# Patient Record
Sex: Male | Born: 1975 | Race: Black or African American | Hispanic: No | Marital: Single | Smoking: Current some day smoker
Health system: Southern US, Community
[De-identification: ages and names within clinical notes are randomized; demographics above are authoritative.]

## PROBLEM LIST (undated history)

## (undated) DIAGNOSIS — F419 Anxiety disorder, unspecified: Secondary | ICD-10-CM

## (undated) DIAGNOSIS — Z789 Other specified health status: Secondary | ICD-10-CM

## (undated) DIAGNOSIS — F329 Major depressive disorder, single episode, unspecified: Secondary | ICD-10-CM

## (undated) DIAGNOSIS — F32A Depression, unspecified: Secondary | ICD-10-CM

## (undated) HISTORY — DX: Anxiety disorder, unspecified: F41.9

## (undated) HISTORY — DX: Major depressive disorder, single episode, unspecified: F32.9

## (undated) HISTORY — DX: Depression, unspecified: F32.A

---

## 1998-06-16 ENCOUNTER — Emergency Department (HOSPITAL_COMMUNITY): Admission: EM | Admit: 1998-06-16 | Discharge: 1998-06-16 | Payer: Self-pay | Admitting: Emergency Medicine

## 1999-08-04 ENCOUNTER — Encounter: Payer: Self-pay | Admitting: Emergency Medicine

## 1999-08-04 ENCOUNTER — Emergency Department (HOSPITAL_COMMUNITY): Admission: EM | Admit: 1999-08-04 | Discharge: 1999-08-04 | Payer: Self-pay | Admitting: Emergency Medicine

## 2006-07-01 ENCOUNTER — Emergency Department (HOSPITAL_COMMUNITY): Admission: EM | Admit: 2006-07-01 | Discharge: 2006-07-01 | Payer: Self-pay | Admitting: Emergency Medicine

## 2006-07-04 ENCOUNTER — Emergency Department (HOSPITAL_COMMUNITY): Admission: EM | Admit: 2006-07-04 | Discharge: 2006-07-04 | Payer: Self-pay | Admitting: Emergency Medicine

## 2006-11-14 ENCOUNTER — Emergency Department (HOSPITAL_COMMUNITY): Admission: EM | Admit: 2006-11-14 | Discharge: 2006-11-14 | Payer: Self-pay | Admitting: Emergency Medicine

## 2007-04-15 ENCOUNTER — Emergency Department (HOSPITAL_COMMUNITY): Admission: EM | Admit: 2007-04-15 | Discharge: 2007-04-15 | Payer: Self-pay | Admitting: Family Medicine

## 2007-06-29 ENCOUNTER — Emergency Department (HOSPITAL_COMMUNITY): Admission: EM | Admit: 2007-06-29 | Discharge: 2007-06-29 | Payer: Self-pay | Admitting: Family Medicine

## 2008-06-19 ENCOUNTER — Emergency Department (HOSPITAL_COMMUNITY): Admission: EM | Admit: 2008-06-19 | Discharge: 2008-06-19 | Payer: Self-pay | Admitting: Family Medicine

## 2009-05-19 ENCOUNTER — Encounter: Admission: RE | Admit: 2009-05-19 | Discharge: 2009-05-19 | Payer: Self-pay | Admitting: Orthopedic Surgery

## 2009-08-02 ENCOUNTER — Emergency Department (HOSPITAL_COMMUNITY): Admission: EM | Admit: 2009-08-02 | Discharge: 2009-08-02 | Payer: Self-pay | Admitting: Family Medicine

## 2010-09-11 ENCOUNTER — Inpatient Hospital Stay (INDEPENDENT_AMBULATORY_CARE_PROVIDER_SITE_OTHER)
Admission: RE | Admit: 2010-09-11 | Discharge: 2010-09-11 | Disposition: A | Discharge status: Home / Self Care | Payer: Self-pay | Source: Ambulatory Visit | Attending: Family Medicine | Admitting: Family Medicine

## 2010-09-11 DIAGNOSIS — S025XXA Fracture of tooth (traumatic), initial encounter for closed fracture: Secondary | ICD-10-CM

## 2010-09-11 DIAGNOSIS — K047 Periapical abscess without sinus: Secondary | ICD-10-CM

## 2010-09-11 DIAGNOSIS — K029 Dental caries, unspecified: Secondary | ICD-10-CM

## 2010-09-11 DIAGNOSIS — K089 Disorder of teeth and supporting structures, unspecified: Secondary | ICD-10-CM

## 2011-06-28 ENCOUNTER — Emergency Department (INDEPENDENT_AMBULATORY_CARE_PROVIDER_SITE_OTHER)
Admission: EM | Admit: 2011-06-28 | Discharge: 2011-06-28 | Disposition: A | Discharge status: Home / Self Care | Payer: Self-pay | Source: Home / Self Care | Attending: Family Medicine | Admitting: Family Medicine

## 2011-06-28 ENCOUNTER — Emergency Department (INDEPENDENT_AMBULATORY_CARE_PROVIDER_SITE_OTHER): Payer: Self-pay

## 2011-06-28 DIAGNOSIS — S93401A Sprain of unspecified ligament of right ankle, initial encounter: Secondary | ICD-10-CM

## 2011-06-28 DIAGNOSIS — S93409A Sprain of unspecified ligament of unspecified ankle, initial encounter: Secondary | ICD-10-CM

## 2011-06-28 MED ORDER — IBUPROFEN 800 MG PO TABS: 800.0000 mg | ORAL_TABLET | Freq: Three times a day (TID) | ORAL | Status: AC

## 2011-06-28 MED ORDER — HYDROCODONE-ACETAMINOPHEN 5-500 MG PO TABS: 1.0000 | ORAL_TABLET | Freq: Four times a day (QID) | ORAL | Status: AC | PRN
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
## 2011-06-28 NOTE — ED Provider Notes (Signed)
History     CSN: [id number]  Arrival date & time 06/28/11  1558   First MD Initiated Contact with Patient 06/28/11 1740      Chief Complaint  Patient presents with  . Foot Injury    (Consider location/radiation/quality/duration/timing/severity/associated sxs/prior treatment) HPI Comments: 36 y/o male no significant PMH here c/o right ankle swelling and pain since yesterday. Reports he injured his ankle when he slipped going down stairs at home and sprainned his right ankle in varus position. Has been swollen and tender since pain with movement and with putting weight on it. No fractures on X rays.   History reviewed. No pertinent past medical history.  History reviewed. No pertinent past surgical history.  History reviewed. No pertinent family history.  History  Substance Use Topics  . Smoking status: Current Everyday Smoker  . Smokeless tobacco: Not on file  . Alcohol Use: No      Review of Systems  Musculoskeletal:       Right ankle swelling and pain as above  Skin:       No cuts or abrasions   Neurological: Negative for dizziness, seizures, syncope and headaches.  All other systems reviewed and are negative.    Allergies  Review of patient's allergies indicates no known allergies.  Home Medications   Current Outpatient Rx  Name Route Sig Dispense Refill  . HYDROCODONE-ACETAMINOPHEN 5-500 MG PO TABS Oral Take 1-2 tablets by mouth every 6 (six) hours as needed for pain. 15 tablet 0  . IBUPROFEN 800 MG PO TABS Oral Take 1 tablet (800 mg total) by mouth 3 (three) times daily. 21 tablet 0    BP 142/81  Pulse 96  Temp(Src) 98.9 F (37.2 C) (Oral)  Resp 16  SpO2 100%  Physical Exam  Nursing note and vitals reviewed. Constitutional: He is oriented to person, place, and time. He appears well-developed and well-nourished. No distress.  HENT:  Head: Normocephalic and atraumatic.  Eyes: EOM are normal. Pupils are equal, round, and reactive to light.  Neck:  Normal range of motion.  Cardiovascular: Normal heart sounds.   Pulmonary/Chest: Breath sounds normal.  Musculoskeletal:       Right ankle: moderate swelling around lateral malleoli worse than medial malleoli tenderness to palpation below and anterior to lateral malleoli. No bruising or hematoma. Severe pain reported with foot flexion and extension and limited range of motion due to pain. No laxity. Distal tibial bone with no focal tenderness swelling or hematoma. Normal dorsomedial and tibial posterior pulses, no distal cyanosis. Sensation intact.  Neurological: He is alert and oriented to person, place, and time.    ED Course  Procedures (including critical care time)  Labs Reviewed - No data to display Dg Ankle Complete Right  06/28/2011  *RADIOLOGY REPORT*  Clinical Data: Fall, ankle swelling  RIGHT ANKLE - COMPLETE 3+ VIEW  Comparison: None.  Findings: Ankle mortise intact.  Talar dome is normal.  No malleolar fracture.  Calcaneus is normal.  IMPRESSION: No evidence of ankle fracture.  Original Report Authenticated By: Genevive Bi, M.D.     1. Right ankle sprain       MDM  Continue ice and elevation, ASO brace, crutches, NSAIDs rehab exercises hand out and follow up with sports medicine as needed.        Sharin Grave, MD 06/29/11 1013

## 2011-08-03 ENCOUNTER — Emergency Department (HOSPITAL_COMMUNITY)
Admission: EM | Admit: 2011-08-03 | Discharge: 2011-08-03 | Disposition: A | Discharge status: Home / Self Care | Payer: No Typology Code available for payment source | Attending: Emergency Medicine | Admitting: Emergency Medicine

## 2011-08-03 ENCOUNTER — Encounter (HOSPITAL_COMMUNITY): Payer: Self-pay

## 2011-08-03 DIAGNOSIS — S39012A Strain of muscle, fascia and tendon of lower back, initial encounter: Secondary | ICD-10-CM

## 2011-08-03 DIAGNOSIS — S339XXA Sprain of unspecified parts of lumbar spine and pelvis, initial encounter: Secondary | ICD-10-CM | POA: Insufficient documentation

## 2011-08-03 DIAGNOSIS — Y92410 Unspecified street and highway as the place of occurrence of the external cause: Secondary | ICD-10-CM | POA: Insufficient documentation

## 2011-08-03 MED ORDER — HYDROCODONE-ACETAMINOPHEN 5-325 MG PO TABS
1.0000 | ORAL_TABLET | Freq: Once | ORAL | Status: AC
  Administered 2011-08-03: 1 via ORAL
  Filled 2011-08-03: qty 1

## 2011-08-03 MED ORDER — IBUPROFEN 800 MG PO TABS: 800.0000 mg | ORAL_TABLET | Freq: Three times a day (TID) | ORAL | Status: AC | PRN

## 2011-08-03 MED ORDER — HYDROCODONE-ACETAMINOPHEN 5-325 MG PO TABS: 2.0000 | ORAL_TABLET | ORAL | Status: AC | PRN

## 2011-08-03 MED ORDER — IBUPROFEN 800 MG PO TABS
800.0000 mg | ORAL_TABLET | Freq: Once | ORAL | Status: AC
  Administered 2011-08-03: 800 mg via ORAL
  Filled 2011-08-03: qty 1

## 2011-08-03 MED ORDER — DIAZEPAM 5 MG PO TABS
5.0000 mg | ORAL_TABLET | Freq: Once | ORAL | Status: AC
  Administered 2011-08-03: 5 mg via ORAL
  Filled 2011-08-03: qty 1

## 2011-08-03 MED ORDER — DIAZEPAM 5 MG PO TABS: 5.0000 mg | ORAL_TABLET | Freq: Three times a day (TID) | ORAL | Status: AC | PRN
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
## 2011-08-04 NOTE — ED Provider Notes (Signed)
Medical screening examination/treatment/procedure(s) were performed by non-physician practitioner and as supervising physician I was immediately available for consultation/collaboration.  Graysin Luczynski, MD 08/04/11 0237 

## 2011-08-14 ENCOUNTER — Encounter (HOSPITAL_COMMUNITY): Payer: Self-pay | Admitting: *Deleted

## 2011-08-14 ENCOUNTER — Emergency Department (HOSPITAL_COMMUNITY)
Admission: EM | Admit: 2011-08-14 | Discharge: 2011-08-14 | Disposition: A | Discharge status: Home Health | Payer: No Typology Code available for payment source | Attending: Emergency Medicine | Admitting: Emergency Medicine

## 2011-08-14 DIAGNOSIS — M545 Low back pain, unspecified: Secondary | ICD-10-CM | POA: Insufficient documentation

## 2011-08-14 DIAGNOSIS — M5380 Other specified dorsopathies, site unspecified: Secondary | ICD-10-CM | POA: Insufficient documentation

## 2011-08-14 DIAGNOSIS — F17200 Nicotine dependence, unspecified, uncomplicated: Secondary | ICD-10-CM | POA: Insufficient documentation

## 2011-08-14 DIAGNOSIS — M79609 Pain in unspecified limb: Secondary | ICD-10-CM | POA: Insufficient documentation

## 2011-08-14 DIAGNOSIS — M62830 Muscle spasm of back: Secondary | ICD-10-CM

## 2011-08-14 DIAGNOSIS — M549 Dorsalgia, unspecified: Secondary | ICD-10-CM

## 2011-08-14 MED ORDER — CYCLOBENZAPRINE HCL 10 MG PO TABS: 10.0000 mg | ORAL_TABLET | Freq: Three times a day (TID) | ORAL | Status: AC | PRN

## 2011-08-14 MED ORDER — DIAZEPAM 5 MG PO TABS: 5.0000 mg | ORAL_TABLET | Freq: Two times a day (BID) | ORAL | Status: AC

## 2011-08-14 NOTE — ED Provider Notes (Signed)
Medical screening examination/treatment/procedure(s) were performed by non-physician practitioner and as supervising physician I was immediately available for consultation/collaboration.  Demetris Meinhardt K Linker, MD 08/14/11 0945 

## 2011-08-14 NOTE — Discharge Instructions (Signed)
Back Exercises Back exercises help treat and prevent back injuries. The goal of back exercises is to increase the strength of your abdominal and back muscles and the flexibility of your back. These exercises should be started when you no longer have back pain. Back exercises include:  Pelvic Tilt. Lie on your back with your knees bent. Tilt your pelvis until the lower part of your back is against the floor. Hold this position 5 to 10 sec and repeat 5 to 10 times.   Knee to Chest. Pull first 1 knee up against your chest and hold for 20 to 30 seconds, repeat this with the other knee, and then both knees. This may be done with the other leg straight or bent, whichever feels better.   Sit-Ups or Curl-Ups. Bend your knees 90 degrees. Start with tilting your pelvis, and do a partial, slow sit-up, lifting your trunk only 30 to 45 degrees off the floor. Take at least 2 to 3 seconds for each sit-up. Do not do sit-ups with your knees out straight. If partial sit-ups are difficult, simply do the above but with only tightening your abdominal muscles and holding it as directed.   Hip-Lift. Lie on your back with your knees flexed 90 degrees. Push down with your feet and shoulders as you raise your hips a couple inches off the floor; hold for 10 seconds, repeat 5 to 10 times.   Back arches. Lie on your stomach, propping yourself up on bent elbows. Slowly press on your hands, causing an arch in your low back. Repeat 3 to 5 times. Any initial stiffness and discomfort should lessen with repetition over time.   Shoulder-Lifts. Lie face down with arms beside your body. Keep hips and torso pressed to floor as you slowly lift your head and shoulders off the floor.  Do not overdo your exercises, especially in the beginning. Exercises may cause you some mild back discomfort which lasts for a few minutes; however, if the pain is more severe, or lasts for more than 15 minutes, do not continue exercises until you see your  caregiver. Improvement with exercise therapy for back problems is slow.  See your caregivers for assistance with developing a proper back exercise program. Document Released: 07/15/2004 Document Revised: 02/03/2011 Document Reviewed: 06/07/2005 [facility] Patient Information 2012 [place], [place].  Lumbosacral Strain Lumbosacral strain is one of the most common causes of back pain. There are many causes of back pain. Most are not serious conditions. CAUSES  Your backbone (spinal column) is made up of 24 main vertebral bodies, the sacrum, and the coccyx. These are held together by muscles and tough, fibrous tissue (ligaments). Nerve roots pass through the openings between the vertebrae. A sudden move or injury to the back may cause injury to, or pressure on, these nerves. This may result in localized back pain or pain movement (radiation) into the buttocks, down the leg, and into the foot. Sharp, shooting pain from the buttock down the back of the leg (sciatica) is frequently associated with a ruptured (herniated) disk. Pain may be caused by muscle spasm alone. Your caregiver can often find the cause of your pain by the details of your symptoms and an exam. In some cases, you may need tests (such as X-rays). Your caregiver will work with you to decide if any tests are needed based on your specific exam. HOME CARE INSTRUCTIONS   Avoid an underactive lifestyle. Active exercise, as directed by your caregiver, is your greatest weapon against back pain.  Avoid hard physical activities (tennis, racquetball, waterskiing) if you are not in proper physical condition for it. This may aggravate or create problems.   If you have a back problem, avoid sports requiring sudden body movements. Swimming and walking are generally safer activities.   Maintain good posture.   Avoid becoming overweight (obese).   Use bed rest for only the most extreme, sudden (acute) episode. Your caregiver will help you determine how  much bed rest is necessary.   For acute conditions, you may put ice on the injured area.   Put ice in a plastic bag.   Place a towel between your skin and the bag.   Leave the ice on for 15 to 20 minutes at a time, every 2 hours, or as needed.   After you are improved and more active, it may help to apply heat for 30 minutes before activities.  See your caregiver if you are having pain that lasts longer than expected. Your caregiver can advise appropriate exercises or therapy if needed. With conditioning, most back problems can be avoided. SEEK IMMEDIATE MEDICAL CARE IF:   You have numbness, tingling, weakness, or problems with the use of your arms or legs.   You experience severe back pain not relieved with medicines.   There is a change in bowel or bladder control.   You have increasing pain in any area of the body, including your belly (abdomen).   You notice shortness of breath, dizziness, or feel faint.   You feel sick to your stomach (nauseous), are throwing up (vomiting), or become sweaty.   You notice discoloration of your toes or legs, or your feet get very cold.   Your back pain is getting worse.   You have a fever.  MAKE SURE YOU:   Understand these instructions.   Will watch your condition.   Will get help right away if you are not doing well or get worse.  Document Released: 03/17/2005 Document Revised: 02/17/2011 Document Reviewed: 09/06/2008 [facility] Patient Information 2012 [place], [place].
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
## 2011-10-28 ENCOUNTER — Encounter (HOSPITAL_COMMUNITY): Payer: Self-pay | Admitting: Emergency Medicine

## 2011-10-28 ENCOUNTER — Emergency Department (INDEPENDENT_AMBULATORY_CARE_PROVIDER_SITE_OTHER)
Admission: EM | Admit: 2011-10-28 | Discharge: 2011-10-28 | Disposition: A | Discharge status: Home / Self Care | Payer: Self-pay | Source: Home / Self Care | Attending: Family Medicine | Admitting: Family Medicine

## 2011-10-28 DIAGNOSIS — M549 Dorsalgia, unspecified: Secondary | ICD-10-CM

## 2011-10-28 MED ORDER — KETOROLAC TROMETHAMINE 60 MG/2ML IM SOLN
INTRAMUSCULAR | Status: AC
  Filled 2011-10-28: qty 2

## 2011-10-28 MED ORDER — ONDANSETRON HCL 4 MG/2ML IJ SOLN
4.0000 mg | Freq: Once | INTRAMUSCULAR | Status: AC
  Administered 2011-10-28: 4 mg via INTRAMUSCULAR

## 2011-10-28 MED ORDER — HYDROMORPHONE HCL PF 1 MG/ML IJ SOLN
INTRAMUSCULAR | Status: AC
  Filled 2011-10-28: qty 1

## 2011-10-28 MED ORDER — ONDANSETRON HCL 4 MG/2ML IJ SOLN
INTRAMUSCULAR | Status: AC
  Filled 2011-10-28: qty 2

## 2011-10-28 MED ORDER — OXYCODONE-ACETAMINOPHEN 5-325 MG PO TABS: 2.0000 | ORAL_TABLET | ORAL | Status: AC | PRN

## 2011-10-28 MED ORDER — KETOROLAC TROMETHAMINE 60 MG/2ML IM SOLN
60.0000 mg | Freq: Once | INTRAMUSCULAR | Status: AC
  Administered 2011-10-28: 60 mg via INTRAMUSCULAR

## 2011-10-28 MED ORDER — HYDROMORPHONE HCL PF 1 MG/ML IJ SOLN
INTRAMUSCULAR | Status: AC
  Filled 2011-10-28: qty 2

## 2011-10-28 MED ORDER — DICLOFENAC SODIUM 75 MG PO TBEC: 75.0000 mg | DELAYED_RELEASE_TABLET | Freq: Two times a day (BID) | ORAL | Status: DC

## 2011-10-28 MED ORDER — PREDNISONE 10 MG PO TABS: ORAL_TABLET | ORAL | Status: DC

## 2011-10-28 MED ORDER — HYDROMORPHONE HCL PF 1 MG/ML IJ SOLN
2.0000 mg | Freq: Once | INTRAMUSCULAR | Status: AC
  Administered 2011-10-28: 2 mg via INTRAMUSCULAR

## 2011-10-28 NOTE — Discharge Instructions (Signed)
Back Pain, Adult Low back pain is very common. About 1 in 5 people have back pain.The cause of low back pain is rarely dangerous. The pain often gets better over time.About half of people with a sudden onset of back pain feel better in just 2 weeks. About 8 in 10 people feel better by 6 weeks.  CAUSES Some common causes of back pain include:  Strain of the muscles or ligaments supporting the spine.   Wear and tear (degeneration) of the spinal discs.   Arthritis.   Direct injury to the back.  DIAGNOSIS Most of the time, the direct cause of low back pain is not known.However, back pain can be treated effectively even when the exact cause of the pain is unknown.Answering your caregiver's questions about your overall health and symptoms is one of the most accurate ways to make sure the cause of your pain is not dangerous. If your caregiver needs more information, he or she may order lab work or imaging tests (X-rays or MRIs).However, even if imaging tests show changes in your back, this usually does not require surgery. HOME CARE INSTRUCTIONS For many people, back pain returns.Since low back pain is rarely dangerous, it is often a condition that people can learn to manageon their own.   Remain active. It is stressful on the back to sit or stand in one place. Do not sit, drive, or stand in one place for more than 30 minutes at a time. Take short walks on level surfaces as soon as pain allows.Try to increase the length of time you walk each day.   Do not stay in bed.Resting more than 1 or 2 days can delay your recovery.   Do not avoid exercise or work.Your body is made to move.It is not dangerous to be active, even though your back may hurt.Your back will likely heal faster if you return to being active before your pain is gone.   Pay attention to your body when you bend and lift. Many people have less discomfortwhen lifting if they bend their knees, keep the load close to their  bodies,and avoid twisting. Often, the most comfortable positions are those that put less stress on your recovering back.   Find a comfortable position to sleep. Use a firm mattress and lie on your side with your knees slightly bent. If you lie on your back, put a pillow under your knees.   Only take over-the-counter or prescription medicines as directed by your caregiver. Over-the-counter medicines to reduce pain and inflammation are often the most helpful.Your caregiver may prescribe muscle relaxant drugs.These medicines help dull your pain so you can more quickly return to your normal activities and healthy exercise.   Put ice on the injured area.   Put ice in a plastic bag.   Place a towel between your skin and the bag.   Leave the ice on for 15 to 20 minutes, 3 to 4 times a day for the first 2 to 3 days. After that, ice and heat may be alternated to reduce pain and spasms.   Ask your caregiver about trying back exercises and gentle massage. This may be of some benefit.   Avoid feeling anxious or stressed.Stress increases muscle tension and can worsen back pain.It is important to recognize when you are anxious or stressed and learn ways to manage it.Exercise is a great option.  SEEK MEDICAL CARE IF:  You have pain that is not relieved with rest or medicine.   You have   pain that does not improve in 1 week.   You have new symptoms.   You are generally not feeling well.  SEEK IMMEDIATE MEDICAL CARE IF:   You have pain that radiates from your back into your legs.   You develop new bowel or bladder control problems.   You have unusual weakness or numbness in your arms or legs.   You develop nausea or vomiting.   You develop abdominal pain.   You feel faint.  Document Released: 06/07/2005 Document Revised: 05/27/2011 Document Reviewed: 10/26/2010 ExitCare Patient Information 2012 [employer]. 
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
## 2011-10-29 NOTE — ED Provider Notes (Signed)
Medical screening examination/treatment/procedure(s) were performed by resident physician or non-physician practitioner and as supervising physician I was immediately available for consultation/collaboration.   Alanee Ting DOUGLAS MD.    Makenli Derstine D Almus Woodham, MD 10/29/11 1220 

## 2011-12-18 ENCOUNTER — Ambulatory Visit: Payer: Self-pay | Admitting: Internal Medicine

## 2011-12-18 VITALS — BP 136/92 | HR 80 | Temp 98.4°F | Resp 16 | Ht 69.5 in | Wt 211.8 lb

## 2011-12-18 DIAGNOSIS — F32A Depression, unspecified: Secondary | ICD-10-CM | POA: Insufficient documentation

## 2011-12-18 DIAGNOSIS — F329 Major depressive disorder, single episode, unspecified: Secondary | ICD-10-CM

## 2011-12-18 DIAGNOSIS — F17200 Nicotine dependence, unspecified, uncomplicated: Secondary | ICD-10-CM | POA: Insufficient documentation

## 2011-12-18 MED ORDER — CITALOPRAM HYDROBROMIDE 20 MG PO TABS: 20.0000 mg | ORAL_TABLET | Freq: Every day | ORAL | Status: DC
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
## 2012-03-09 ENCOUNTER — Telehealth: Payer: Self-pay

## 2012-03-09 MED ORDER — CITALOPRAM HYDROBROMIDE 20 MG PO TABS: 20.0000 mg | ORAL_TABLET | Freq: Every day | ORAL | Status: DC

## 2012-03-09 NOTE — Telephone Encounter (Signed)
Refill sent in. Will need office visit next time.

## 2012-03-09 NOTE — Telephone Encounter (Signed)
Pt would like to see if he can get a rx refill on his depression meds and he is not sure if he can or not. [phone].

## 2012-03-10 NOTE — Telephone Encounter (Signed)
I advised patient he needs to be seen for additional refills/ before these run out.

## 2012-03-10 NOTE — Telephone Encounter (Signed)
No anw/VM not set up on cell # left by pt. H# is not valid. Unable to LM

## 2012-06-06 DIAGNOSIS — E119 Type 2 diabetes mellitus without complications: Secondary | ICD-10-CM | POA: Insufficient documentation

## 2013-03-08 ENCOUNTER — Ambulatory Visit: Payer: Self-pay | Admitting: Physician Assistant

## 2013-03-08 VITALS — BP 120/92 | HR 89 | Temp 98.9°F | Resp 18 | Ht 69.5 in | Wt 221.0 lb

## 2013-03-08 DIAGNOSIS — F418 Other specified anxiety disorders: Secondary | ICD-10-CM

## 2013-03-08 DIAGNOSIS — F341 Dysthymic disorder: Secondary | ICD-10-CM

## 2013-03-08 MED ORDER — CITALOPRAM HYDROBROMIDE 40 MG PO TABS: 40.0000 mg | ORAL_TABLET | Freq: Every day | ORAL | Status: DC

## 2013-03-08 NOTE — Patient Instructions (Addendum)
For the 1st 4 pills take a half Call me in a 1 month about result and for a refill.

## 2013-03-08 NOTE — Progress Notes (Signed)
   [address] [place] [address]   Phone [phone]  Subjective:    Patient ID: Lance Cline, male    DOB: [birth year], 37 y.o.   MRN: [id number]  HPI Pt presents to clinic for another Rx of Celexa.  He took it about 5 months and felt like it helped but he did not feel like his depression was completely under control.  He has been off these medications for months and feels like he needs to be back on the medications.  His sleep is slightly disrupted but he would like to try and get his depression and anxiety controlled 1st.  He is concerned about multiple office visits due to no insurance.  He would like to quit smoking.  Review of Systems  Psychiatric/Behavioral: Positive for sleep disturbance and dysphoric mood. Negative for suicidal ideas. The patient is nervous/anxious.       Objective:   Physical Exam  Vitals reviewed. Constitutional: He is oriented to person, place, and time. He appears well-developed and well-nourished.  HENT:  Head: Normocephalic and atraumatic.  Right Ear: External ear normal.  Left Ear: External ear normal.  Pulmonary/Chest: Effort normal.  Neurological: He is alert and oriented to person, place, and time.  Psychiatric: He has a normal mood and affect. His behavior is normal. Judgment and thought content normal.       Assessment & Plan:  Depression with anxiety - Plan: citalopram (CELEXA) 40 MG tablet  Plan to restart Celexa - will start at Celexa 20mg  and increase to 40mg  within a week.  He will monitor his results and call me in a month with his progress and if he is happy with the results we will do a 90d supply.  If still having trouble with sleep will add Trazodone.    Benny Lennert PA-C 03/08/2013 4:54 PM

## 2013-04-01 ENCOUNTER — Telehealth: Payer: Self-pay

## 2013-04-01 NOTE — Telephone Encounter (Signed)
PATIENT STATES HE HAS BEEN TRYING TO SPEAK WITH SARAH WEBER. HE IS CALLING REGARDING THE MEDICATION SHE PUT HIM ON. HE WOULD NOT GIVE ANY OTHER ADDITIONAL INFORMATION. BEST PHONE [phone] (CELL)  MBC

## 2013-04-04 NOTE — Telephone Encounter (Signed)
Spoke to patient, he states he was given meds for depression, they are working fine, he does not feel as depressed. He states he is having problems with getting/ maintaining his erections. He indicates he does not want to change the medication because it helps him. He wants to know if he can get Cialis

## 2013-04-08 ENCOUNTER — Other Ambulatory Visit: Payer: Self-pay | Admitting: Physician Assistant

## 2013-04-08 ENCOUNTER — Telehealth: Payer: Self-pay

## 2013-04-08 NOTE — Telephone Encounter (Signed)
Patient calling to get a refill on some medication for his depression. citalopram (CELEXA) 40 MG tablet. He says that he was supposed to let Huey Romans know so she can authorize the refill. I told him I will put in the message for him not a problem. I also told him to still contact the pharmacy to send [place] an electronic request. Just in case. Please advise. He is also waiting on a call from someone clinical about his male issues, he would not give more info. Please see previous message in Epic. thank you!  Pharmacy: [id number] - [place], [place] - [address]  [phone]

## 2013-04-09 ENCOUNTER — Telehealth: Payer: Self-pay | Admitting: Radiology

## 2013-04-09 MED ORDER — TADALAFIL 10 MG PO TABS: 10.0000 mg | ORAL_TABLET | Freq: Every day | ORAL | Status: DC | PRN

## 2013-04-09 NOTE — Telephone Encounter (Signed)
I have a couple of thoughts - 1- If the med is causing the SE I think that changing the meds would be the best - I would switch to Lexapro but it might be more expensive and then using Cialis rarely.  So I am willing to write this medication and I will send it to the pharmacy for the patient.  If this has happened when he is off the med in the past he might think about getting a testosterone level drawn to make sure that is not it.

## 2013-04-09 NOTE — Telephone Encounter (Signed)
Patient notified and voiced understanding. He is not wanting to change medication. He states medication is working great and does not want to change.

## 2013-04-09 NOTE — Telephone Encounter (Signed)
Patient asked for celexa to be called in to [facility] on [address] (originally sent to [facility] which is now closed) patient going out of town in morning so requested to pick up medication tonight.  Called [facility] to [facility] pharmacy for patient

## 2013-04-09 NOTE — Telephone Encounter (Signed)
I called pt back to advise him that I had RFd his citalopram earlier today. 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 Pt reports he has used Cialis in the past and it has been effective for him and he had no SEs from it. Sarah, please have someone call pt back if possible after you address.

## 2013-04-09 NOTE — Telephone Encounter (Signed)
Pt is calling to see about his medication refill he states he has been calling for a week and hasnt heard anything. Call back number  [phone]

## 2013-04-09 NOTE — Telephone Encounter (Signed)
Pt has called back again and would like if Benny Lennert can call him back asap Call back number is [phone]

## 2013-04-09 NOTE — Telephone Encounter (Signed)
Correction: cialis 10mg  needed to be sent to [facility] on [address] (not celexa as stated in previous telephone message) spoke with pharmacist and corrected this.  Pharmacist canceled celexa.

## 2013-04-09 NOTE — Telephone Encounter (Signed)
Took care of in another message.

## 2014-01-04 ENCOUNTER — Other Ambulatory Visit: Payer: Self-pay | Admitting: Physician Assistant

## 2014-01-05 ENCOUNTER — Telehealth: Payer: Self-pay

## 2014-01-05 NOTE — Telephone Encounter (Signed)
PATIETNT IS CALLING BECAUSE [facility] AND THE PHARMACY HASN'T RECEIVED ANYTHING. HE WOULD LIKE HIS MEDICATION FOR CITALOPRAM TODAY IF POSSIBLE. 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 PLEASE CALL PATIENT! [phone]

## 2014-01-05 NOTE — Telephone Encounter (Signed)
Pt call  and spoke with kaya, told her he had not heard anything, kaya began asking questions and he became verbally aggressive and she terminated the call Pt called back, I received the call patient frustrated because we have not responded to a pharmacy request (pharmacy in [place]) in trying to sort what was going on for him, I explained I would take this information and forward his concerns regarding refill request to clinical TL and he wanted me to guarantee we would call in rx today, I explained I cant make any guarantees, pt states "i'll get a new doctor" and he hung up on me

## 2014-01-07 MED ORDER — CITALOPRAM HYDROBROMIDE 40 MG PO TABS: ORAL_TABLET | ORAL | Status: DC

## 2014-01-07 NOTE — Telephone Encounter (Signed)
Spoke w/pt and advised that he is overdue for f/up, but that I can send in a 30 day supply to hold him until he returns to town (he is a Naval architecttruck driver). Pt agreed and gave me new pharm in [place] to send it. Done.

## 2014-03-01 ENCOUNTER — Ambulatory Visit (INDEPENDENT_AMBULATORY_CARE_PROVIDER_SITE_OTHER): Payer: Self-pay | Admitting: Physician Assistant

## 2014-03-01 VITALS — BP 122/78 | HR 95 | Temp 98.0°F | Resp 17 | Ht 70.0 in | Wt 224.0 lb

## 2014-03-01 DIAGNOSIS — Z202 Contact with and (suspected) exposure to infections with a predominantly sexual mode of transmission: Secondary | ICD-10-CM

## 2014-03-01 DIAGNOSIS — F3289 Other specified depressive episodes: Secondary | ICD-10-CM

## 2014-03-01 DIAGNOSIS — N529 Male erectile dysfunction, unspecified: Secondary | ICD-10-CM

## 2014-03-01 DIAGNOSIS — F32A Depression, unspecified: Secondary | ICD-10-CM

## 2014-03-01 DIAGNOSIS — F329 Major depressive disorder, single episode, unspecified: Secondary | ICD-10-CM

## 2014-03-01 MED ORDER — CITALOPRAM HYDROBROMIDE 40 MG PO TABS: 40.0000 mg | ORAL_TABLET | Freq: Every day | ORAL | Status: DC

## 2014-03-01 MED ORDER — METRONIDAZOLE 500 MG PO TABS: 2000.0000 mg | ORAL_TABLET | Freq: Once | ORAL | Status: AC

## 2014-03-01 MED ORDER — TADALAFIL 10 MG PO TABS: 10.0000 mg | ORAL_TABLET | Freq: Every day | ORAL | Status: DC | PRN

## 2014-03-01 MED ORDER — TADALAFIL 20 MG PO TABS: 20.0000 mg | ORAL_TABLET | ORAL | Status: DC | PRN

## 2014-03-01 NOTE — Progress Notes (Signed)
   Subjective:    Patient ID: Lance Cline, male    DOB: January 15, 1976, 38 y.o.   MRN: [id number]  HPI Pt presents to clinic for several concerns. 1- needs a refill of his Celexa.  He ran out a few days ago and knows he feels better while on the medication.  Over the last few days he has had some withdraw symptoms.  When off it he is irritable and has depressed mood. 2- would like a refill on his Cialis - he does not think that  always is enough and sometimes has to take 2 pills to maintain an erection.  He does not have trouble all the time but it works when he does. 3- he is in a stable monogamous relationship currently but his previous partner recently told him that she had been diagnosed with trich and he should be treated.  He does not have any dysuria, penile discharge.    Review of Systems  Genitourinary: Negative for discharge and penile pain.  Psychiatric/Behavioral: Positive for dysphoric mood. Negative for sleep disturbance. The patient is nervous/anxious.        Objective:   Physical Exam  Constitutional: He is oriented to person, place, and time. He appears well-developed and well-nourished.  HENT:  Head: Normocephalic and atraumatic.  Right Ear: External ear normal.  Left Ear: External ear normal.  Eyes: Conjunctivae are normal.  Cardiovascular: Normal rate, regular rhythm and normal heart sounds.   No murmur heard. Pulmonary/Chest: Effort normal and breath sounds normal. He has no wheezes.  Neurological: He is alert and oriented to person, place, and time.  Skin: Skin is warm and dry.  Psychiatric: He has a normal mood and affect. His behavior is normal. Judgment and thought content normal.       Assessment & Plan:  Exposure to trichomonas - We will treat for trich due to cost of test.  Did recommend him getting checked for gonorrhea and chlamydia but he states that his current partner has insurance and he will get her to be tested and then if she is positive for  anything he will get tested.  They are not currently using barrier protection.  Plan: metroNIDAZOLE (FLAGYL) 500 MG tablet  Depression - Continue medication. Plan: citalopram (CELEXA) 40 MG tablet  Impotence - Continue medication as needed.  Suggested that pt get his testosterone checked at some point.  He states that he plans to have a CPE at some point with me after he saves some money.  Plan: tadalafil (CIALIS) 20 MG tablet, DISCONTINUED: tadalafil (CIALIS) 10 MG tablet  Benny Lennert PA-C  [facility] 03/01/2014 12:30 PM

## 2014-05-24 ENCOUNTER — Telehealth: Payer: Self-pay

## 2014-05-24 MED ORDER — METRONIDAZOLE 500 MG PO TABS: ORAL_TABLET | ORAL | Status: DC

## 2014-05-24 NOTE — Telephone Encounter (Signed)
Rx re-sent. Notified pt.

## 2014-05-24 NOTE — Telephone Encounter (Signed)
Per Lance LennertSarah Cline he can have a refill

## 2014-05-24 NOTE — Telephone Encounter (Addendum)
Patient says he lost rx for metronidazole and needs another

## 2014-06-11 ENCOUNTER — Telehealth: Payer: Self-pay

## 2014-06-11 DIAGNOSIS — N529 Male erectile dysfunction, unspecified: Secondary | ICD-10-CM

## 2014-06-11 NOTE — Telephone Encounter (Signed)
Patient requesting refill on Cialis.

## 2014-06-13 MED ORDER — TADALAFIL 20 MG PO TABS: 20.0000 mg | ORAL_TABLET | ORAL | Status: DC | PRN

## 2014-08-27 ENCOUNTER — Telehealth: Payer: Self-pay

## 2014-08-27 ENCOUNTER — Other Ambulatory Visit: Payer: Self-pay | Admitting: Physician Assistant

## 2014-08-27 NOTE — Telephone Encounter (Signed)
Done

## 2014-08-27 NOTE — Telephone Encounter (Signed)
Spoke with pt, advised Rx was sent. Pt understood.

## 2014-08-27 NOTE — Telephone Encounter (Signed)
Patient would like a refill of Cialis.  He leaves for work tomorrow morning, so if possible, he would like [facility] [address]  CB#: [phone]

## 2014-10-26 ENCOUNTER — Inpatient Hospital Stay (HOSPITAL_COMMUNITY): Payer: Self-pay

## 2014-10-26 ENCOUNTER — Emergency Department (HOSPITAL_COMMUNITY): Payer: MEDICAID

## 2014-10-26 ENCOUNTER — Encounter (HOSPITAL_COMMUNITY): Admission: EM | Disposition: A | Discharge status: Rehabilitation Facility | Payer: Self-pay | Source: Home / Self Care

## 2014-10-26 ENCOUNTER — Encounter (HOSPITAL_COMMUNITY): Payer: Self-pay | Admitting: *Deleted

## 2014-10-26 ENCOUNTER — Emergency Department (HOSPITAL_COMMUNITY): Payer: Self-pay

## 2014-10-26 ENCOUNTER — Inpatient Hospital Stay (HOSPITAL_COMMUNITY): Payer: Self-pay | Admitting: Anesthesiology

## 2014-10-26 ENCOUNTER — Inpatient Hospital Stay (HOSPITAL_COMMUNITY)
Admission: EM | Admit: 2014-10-26 | Discharge: 2014-11-20 | DRG: 956 | Disposition: A | Discharge status: Rehabilitation Facility | Payer: Self-pay | Attending: Surgery | Admitting: Surgery

## 2014-10-26 ENCOUNTER — Inpatient Hospital Stay (HOSPITAL_COMMUNITY): Payer: MEDICAID | Admitting: Anesthesiology

## 2014-10-26 ENCOUNTER — Inpatient Hospital Stay (HOSPITAL_COMMUNITY): Payer: MEDICAID

## 2014-10-26 DIAGNOSIS — Z09 Encounter for follow-up examination after completed treatment for conditions other than malignant neoplasm: Secondary | ICD-10-CM

## 2014-10-26 DIAGNOSIS — E46 Unspecified protein-calorie malnutrition: Secondary | ICD-10-CM | POA: Diagnosis not present

## 2014-10-26 DIAGNOSIS — S32810A Multiple fractures of pelvis with stable disruption of pelvic ring, initial encounter for closed fracture: Secondary | ICD-10-CM | POA: Diagnosis present

## 2014-10-26 DIAGNOSIS — F17210 Nicotine dependence, cigarettes, uncomplicated: Secondary | ICD-10-CM | POA: Diagnosis present

## 2014-10-26 DIAGNOSIS — T1490XA Injury, unspecified, initial encounter: Secondary | ICD-10-CM

## 2014-10-26 DIAGNOSIS — T148XXA Other injury of unspecified body region, initial encounter: Secondary | ICD-10-CM

## 2014-10-26 DIAGNOSIS — K5900 Constipation, unspecified: Secondary | ICD-10-CM | POA: Diagnosis not present

## 2014-10-26 DIAGNOSIS — Y906 Blood alcohol level of 120-199 mg/100 ml: Secondary | ICD-10-CM | POA: Diagnosis present

## 2014-10-26 DIAGNOSIS — S7292XA Unspecified fracture of left femur, initial encounter for closed fracture: Secondary | ICD-10-CM

## 2014-10-26 DIAGNOSIS — S3282XA Multiple fractures of pelvis without disruption of pelvic ring, initial encounter for closed fracture: Secondary | ICD-10-CM | POA: Diagnosis present

## 2014-10-26 DIAGNOSIS — Z6831 Body mass index (BMI) 31.0-31.9, adult: Secondary | ICD-10-CM

## 2014-10-26 DIAGNOSIS — L299 Pruritus, unspecified: Secondary | ICD-10-CM | POA: Diagnosis not present

## 2014-10-26 DIAGNOSIS — S82402A Unspecified fracture of shaft of left fibula, initial encounter for closed fracture: Secondary | ICD-10-CM | POA: Diagnosis present

## 2014-10-26 DIAGNOSIS — S299XXA Unspecified injury of thorax, initial encounter: Secondary | ICD-10-CM

## 2014-10-26 DIAGNOSIS — I959 Hypotension, unspecified: Secondary | ICD-10-CM | POA: Diagnosis present

## 2014-10-26 DIAGNOSIS — S32402A Unspecified fracture of left acetabulum, initial encounter for closed fracture: Secondary | ICD-10-CM | POA: Diagnosis present

## 2014-10-26 DIAGNOSIS — E440 Moderate protein-calorie malnutrition: Secondary | ICD-10-CM | POA: Diagnosis present

## 2014-10-26 DIAGNOSIS — S32119A Unspecified Zone I fracture of sacrum, initial encounter for closed fracture: Secondary | ICD-10-CM | POA: Diagnosis present

## 2014-10-26 DIAGNOSIS — S7291XA Unspecified fracture of right femur, initial encounter for closed fracture: Secondary | ICD-10-CM | POA: Diagnosis present

## 2014-10-26 DIAGNOSIS — S81802A Unspecified open wound, left lower leg, initial encounter: Secondary | ICD-10-CM | POA: Diagnosis present

## 2014-10-26 DIAGNOSIS — IMO0002 Reserved for concepts with insufficient information to code with codable children: Secondary | ICD-10-CM

## 2014-10-26 DIAGNOSIS — Z419 Encounter for procedure for purposes other than remedying health state, unspecified: Secondary | ICD-10-CM

## 2014-10-26 DIAGNOSIS — S72351A Displaced comminuted fracture of shaft of right femur, initial encounter for closed fracture: Secondary | ICD-10-CM | POA: Diagnosis present

## 2014-10-26 DIAGNOSIS — S72352C Displaced comminuted fracture of shaft of left femur, initial encounter for open fracture type IIIA, IIIB, or IIIC: Principal | ICD-10-CM | POA: Diagnosis present

## 2014-10-26 DIAGNOSIS — R52 Pain, unspecified: Secondary | ICD-10-CM

## 2014-10-26 DIAGNOSIS — J9811 Atelectasis: Secondary | ICD-10-CM | POA: Diagnosis not present

## 2014-10-26 DIAGNOSIS — F10129 Alcohol abuse with intoxication, unspecified: Secondary | ICD-10-CM | POA: Diagnosis present

## 2014-10-26 DIAGNOSIS — S32409A Unspecified fracture of unspecified acetabulum, initial encounter for closed fracture: Secondary | ICD-10-CM

## 2014-10-26 DIAGNOSIS — J029 Acute pharyngitis, unspecified: Secondary | ICD-10-CM | POA: Diagnosis not present

## 2014-10-26 DIAGNOSIS — J9600 Acute respiratory failure, unspecified whether with hypoxia or hypercapnia: Secondary | ICD-10-CM | POA: Diagnosis not present

## 2014-10-26 DIAGNOSIS — D62 Acute posthemorrhagic anemia: Secondary | ICD-10-CM | POA: Diagnosis not present

## 2014-10-26 DIAGNOSIS — R000 Tachycardia, unspecified: Secondary | ICD-10-CM | POA: Diagnosis present

## 2014-10-26 DIAGNOSIS — R609 Edema, unspecified: Secondary | ICD-10-CM

## 2014-10-26 DIAGNOSIS — S7291XB Unspecified fracture of right femur, initial encounter for open fracture type I or II: Secondary | ICD-10-CM

## 2014-10-26 DIAGNOSIS — S7292XB Unspecified fracture of left femur, initial encounter for open fracture type I or II: Secondary | ICD-10-CM

## 2014-10-26 DIAGNOSIS — Z23 Encounter for immunization: Secondary | ICD-10-CM

## 2014-10-26 HISTORY — DX: Other specified health status: Z78.9

## 2014-10-26 HISTORY — PX: INCISION AND DRAINAGE: SHX5863

## 2014-10-26 HISTORY — PX: FEMUR IM NAIL: SHX1597

## 2014-10-26 LAB — PROTIME-INR
INR: 1.09 (ref 0.00–1.49)
INR: 1.19 (ref 0.00–1.49)
PROTHROMBIN TIME: 14.2 s (ref 11.6–15.2)
PROTHROMBIN TIME: 15.2 s (ref 11.6–15.2)

## 2014-10-26 LAB — POCT I-STAT 7, (LYTES, BLD GAS, ICA,H+H)
ACID-BASE DEFICIT: 10 mmol/L — AB (ref 0.0–2.0)
ACID-BASE DEFICIT: 7 mmol/L — AB (ref 0.0–2.0)
ACID-BASE DEFICIT: 6 mmol/L — AB (ref 0.0–2.0)
Acid-base deficit: 8 mmol/L — ABNORMAL HIGH (ref 0.0–2.0)
Bicarbonate: 17.9 meq/L — ABNORMAL LOW (ref 20.0–24.0)
Bicarbonate: 19.6 meq/L — ABNORMAL LOW (ref 20.0–24.0)
Bicarbonate: 19.1 meq/L — ABNORMAL LOW (ref 20.0–24.0)
Bicarbonate: 19.4 meq/L — ABNORMAL LOW (ref 20.0–24.0)
CALCIUM ION: 1.01 mmol/L — AB (ref 1.12–1.23)
CALCIUM ION: 0.93 mmol/L — AB (ref 1.12–1.23)
Calcium, Ion: 0.97 mmol/L — ABNORMAL LOW (ref 1.12–1.23)
Calcium, Ion: 0.94 mmol/L — ABNORMAL LOW (ref 1.12–1.23)
HCT: 31 % — ABNORMAL LOW (ref 39.0–52.0)
HCT: 30 % — ABNORMAL LOW (ref 39.0–52.0)
HCT: 28 % — ABNORMAL LOW (ref 39.0–52.0)
HEMATOCRIT: 26 % — AB (ref 39.0–52.0)
Hemoglobin: 10.5 g/dL — ABNORMAL LOW (ref 13.0–17.0)
Hemoglobin: 8.8 g/dL — ABNORMAL LOW (ref 13.0–17.0)
Hemoglobin: 10.2 g/dL — ABNORMAL LOW (ref 13.0–17.0)
Hemoglobin: 9.5 g/dL — ABNORMAL LOW (ref 13.0–17.0)
O2 SAT: 99 %
O2 SAT: 98 %
O2 Saturation: 100 %
O2 Saturation: 98 %
PCO2 ART: 46.5 mmHg — AB (ref 35.0–45.0)
PH ART: 7.193 — AB (ref 7.350–7.450)
PO2 ART: 291 mmHg — AB (ref 80.0–100.0)
POTASSIUM: 4.6 mmol/L (ref 3.5–5.1)
Patient temperature: 36.6
Patient temperature: 36.7
Potassium: 4 mmol/L (ref 3.5–5.1)
Potassium: 4.5 mmol/L (ref 3.5–5.1)
Potassium: 4.6 mmol/L (ref 3.5–5.1)
SODIUM: 139 mmol/L (ref 135–145)
Sodium: 136 mmol/L (ref 135–145)
Sodium: 136 mmol/L (ref 135–145)
Sodium: 137 mmol/L (ref 135–145)
TCO2: 19 mmol/L (ref 0–100)
TCO2: 21 mmol/L (ref 0–100)
TCO2: 20 mmol/L (ref 0–100)
TCO2: 21 mmol/L (ref 0–100)
pCO2 arterial: 39.3 mmHg (ref 35.0–45.0)
pCO2 arterial: 45.5 mmHg — ABNORMAL HIGH (ref 35.0–45.0)
pCO2 arterial: 37.3 mmHg (ref 35.0–45.0)
pH, Arterial: 7.301 — ABNORMAL LOW (ref 7.350–7.450)
pH, Arterial: 7.228 — ABNORMAL LOW (ref 7.350–7.450)
pH, Arterial: 7.322 — ABNORMAL LOW (ref 7.350–7.450)
pO2, Arterial: 155 mmHg — ABNORMAL HIGH (ref 80.0–100.0)
pO2, Arterial: 126 mmHg — ABNORMAL HIGH (ref 80.0–100.0)
pO2, Arterial: 119 mmHg — ABNORMAL HIGH (ref 80.0–100.0)

## 2014-10-26 LAB — COMPREHENSIVE METABOLIC PANEL
ALK PHOS: 65 U/L (ref 38–126)
ALT: 116 U/L — ABNORMAL HIGH (ref 17–63)
AST: 146 U/L — ABNORMAL HIGH (ref 15–41)
Albumin: 3.3 g/dL — ABNORMAL LOW (ref 3.5–5.0)
Anion gap: 12 (ref 5–15)
BILIRUBIN TOTAL: 0.3 mg/dL (ref 0.3–1.2)
BUN: 7 mg/dL (ref 6–20)
CALCIUM: 7.8 mg/dL — AB (ref 8.9–10.3)
CO2: 19 mmol/L — AB (ref 22–32)
Chloride: 102 mmol/L (ref 101–111)
Creatinine, Ser: 1.42 mg/dL — ABNORMAL HIGH (ref 0.61–1.24)
GLUCOSE: 371 mg/dL — AB (ref 70–99)
POTASSIUM: 3.2 mmol/L — AB (ref 3.5–5.1)
Sodium: 133 mmol/L — ABNORMAL LOW (ref 135–145)
Total Protein: 5.9 g/dL — ABNORMAL LOW (ref 6.5–8.1)

## 2014-10-26 LAB — I-STAT ARTERIAL BLOOD GAS, ED
Acid-base deficit: 7 mmol/L — ABNORMAL HIGH (ref 0.0–2.0)
Bicarbonate: 19.9 mEq/L — ABNORMAL LOW (ref 20.0–24.0)
O2 SAT: 100 %
TCO2: 21 mmol/L (ref 0–100)
pCO2 arterial: 44.8 mmHg (ref 35.0–45.0)
pH, Arterial: 7.255 — ABNORMAL LOW (ref 7.350–7.450)
pO2, Arterial: 287 mmHg — ABNORMAL HIGH (ref 80.0–100.0)

## 2014-10-26 LAB — CBC
HEMATOCRIT: 37.7 % — AB (ref 39.0–52.0)
HEMATOCRIT: 28 % — AB (ref 39.0–52.0)
HEMOGLOBIN: 12.6 g/dL — AB (ref 13.0–17.0)
Hemoglobin: 9.8 g/dL — ABNORMAL LOW (ref 13.0–17.0)
MCH: 29 pg (ref 26.0–34.0)
MCH: 29.3 pg (ref 26.0–34.0)
MCHC: 33.4 g/dL (ref 30.0–36.0)
MCHC: 35 g/dL (ref 30.0–36.0)
MCV: 86.7 fL (ref 78.0–100.0)
MCV: 83.6 fL (ref 78.0–100.0)
Platelets: 237 10*3/uL (ref 150–400)
Platelets: 104 10*3/uL — ABNORMAL LOW (ref 150–400)
RBC: 4.35 MIL/uL (ref 4.22–5.81)
RBC: 3.35 MIL/uL — ABNORMAL LOW (ref 4.22–5.81)
RDW: 15.5 % (ref 11.5–15.5)
RDW: 14.7 % (ref 11.5–15.5)
WBC: 8.4 10*3/uL (ref 4.0–10.5)
WBC: 6.1 10*3/uL (ref 4.0–10.5)

## 2014-10-26 LAB — I-STAT CG4 LACTIC ACID, ED: LACTIC ACID, VENOUS: 5.12 mmol/L — AB (ref 0.5–2.0)

## 2014-10-26 LAB — LACTIC ACID, PLASMA: Lactic Acid, Venous: 3.6 mmol/L (ref 0.5–2.0)

## 2014-10-26 LAB — I-STAT CHEM 8, ED
BUN: 7 mg/dL (ref 6–20)
CALCIUM ION: 1.1 mmol/L — AB (ref 1.12–1.23)
Chloride: 99 mmol/L — ABNORMAL LOW (ref 101–111)
Creatinine, Ser: 1.5 mg/dL — ABNORMAL HIGH (ref 0.61–1.24)
Glucose, Bld: 370 mg/dL — ABNORMAL HIGH (ref 70–99)
HEMATOCRIT: 43 % (ref 39.0–52.0)
HEMOGLOBIN: 14.6 g/dL (ref 13.0–17.0)
POTASSIUM: 3.1 mmol/L — AB (ref 3.5–5.1)
Sodium: 139 mmol/L (ref 135–145)
TCO2: 17 mmol/L (ref 0–100)

## 2014-10-26 LAB — CDS SEROLOGY

## 2014-10-26 LAB — MRSA PCR SCREENING: MRSA by PCR: NEGATIVE

## 2014-10-26 LAB — BASIC METABOLIC PANEL
ANION GAP: 3 — AB (ref 5–15)
BUN: 7 mg/dL (ref 6–20)
CALCIUM: 6.5 mg/dL — AB (ref 8.9–10.3)
CO2: 25 mmol/L (ref 22–32)
Chloride: 108 mmol/L (ref 101–111)
Creatinine, Ser: 1.16 mg/dL (ref 0.61–1.24)
GFR calc non Af Amer: >60 mL/min (ref >60)
Glucose, Bld: 275 mg/dL — ABNORMAL HIGH (ref 70–99)
POTASSIUM: 4 mmol/L (ref 3.5–5.1)
Sodium: 136 mmol/L (ref 135–145)

## 2014-10-26 LAB — PREPARE RBC (CROSSMATCH)

## 2014-10-26 LAB — ABO/RH: ABO/RH(D): AB POS

## 2014-10-26 LAB — ETHANOL: ALCOHOL ETHYL (B): 162 mg/dL — AB (ref <5)

## 2014-10-26 SURGERY — INSERTION, INTRAMEDULLARY ROD, FEMUR
Anesthesia: General | Site: Thigh | Laterality: Left

## 2014-10-26 MED ORDER — PENICILLIN G POTASSIUM 5000000 UNITS IJ SOLR
4.0000 10*6.[IU] | INTRAVENOUS | Status: AC
  Administered 2014-10-26: 4 10*6.[IU] via INTRAVENOUS
  Filled 2014-10-26: qty 4

## 2014-10-26 MED ORDER — CEFAZOLIN SODIUM-DEXTROSE 2-3 GM-% IV SOLR: 2.0000 g | INTRAVENOUS | Status: DC

## 2014-10-26 MED ORDER — SODIUM CHLORIDE 0.9 % IR SOLN
Status: DC | PRN
  Administered 2014-10-26 (×4): 3000 mL

## 2014-10-26 MED ORDER — FENTANYL BOLUS VIA INFUSION
50.0000 ug | INTRAVENOUS | Status: DC | PRN
  Filled 2014-10-26: qty 50

## 2014-10-26 MED ORDER — VECURONIUM BROMIDE 10 MG IV SOLR
INTRAVENOUS | Status: DC | PRN
  Administered 2014-10-26: 2 mg via INTRAVENOUS
  Administered 2014-10-26: 5 mg via INTRAVENOUS

## 2014-10-26 MED ORDER — STERILE WATER FOR INJECTION IJ SOLN
INTRAMUSCULAR | Status: AC
  Filled 2014-10-26: qty 10

## 2014-10-26 MED ORDER — GENTAMICIN SULFATE 40 MG/ML IJ SOLN
7.0000 mg/kg | INTRAMUSCULAR | Status: DC
  Administered 2014-10-26: 620 mg via INTRAVENOUS
  Filled 2014-10-26 (×2): qty 15.5

## 2014-10-26 MED ORDER — FENTANYL CITRATE (PF) 100 MCG/2ML IJ SOLN
INTRAMUSCULAR | Status: DC | PRN
  Administered 2014-10-26 (×2): 100 ug via INTRAVENOUS
  Administered 2014-10-26: 250 ug
  Administered 2014-10-26: 250 ug via INTRAVENOUS
  Administered 2014-10-26: 50 ug via INTRAVENOUS

## 2014-10-26 MED ORDER — MIDAZOLAM HCL 2 MG/2ML IJ SOLN
INTRAMUSCULAR | Status: AC
  Filled 2014-10-26: qty 4

## 2014-10-26 MED ORDER — CHLORHEXIDINE GLUCONATE 0.12 % MT SOLN: 15.0000 mL | Freq: Two times a day (BID) | OROMUCOSAL | Status: DC

## 2014-10-26 MED ORDER — PROPOFOL 1000 MG/100ML IV EMUL
5.0000 ug/kg/min | Freq: Once | INTRAVENOUS | Status: AC
  Administered 2014-10-26 (×2): 10 ug/kg/min via INTRAVENOUS

## 2014-10-26 MED ORDER — FENTANYL CITRATE (PF) 100 MCG/2ML IJ SOLN: 100.0000 ug | Freq: Once | INTRAMUSCULAR | Status: DC

## 2014-10-26 MED ORDER — 0.9 % SODIUM CHLORIDE (POUR BTL) OPTIME
TOPICAL | Status: DC | PRN
  Administered 2014-10-26: 1000 mL

## 2014-10-26 MED ORDER — GENTAMICIN IN SALINE 1-0.9 MG/ML-% IV SOLN
100.0000 mg | INTRAVENOUS | Status: AC
  Administered 2014-10-26: 100 mg via INTRAVENOUS
  Filled 2014-10-26: qty 100

## 2014-10-26 MED ORDER — TETANUS-DIPHTH-ACELL PERTUSSIS 5-2.5-18.5 LF-MCG/0.5 IM SUSP
0.5000 mL | Freq: Once | INTRAMUSCULAR | Status: AC
  Administered 2014-10-26: 0.5 mL via INTRAMUSCULAR
  Filled 2014-10-26: qty 0.5

## 2014-10-26 MED ORDER — SODIUM CHLORIDE 0.9 % IV BOLUS (SEPSIS)
1000.0000 mL | Freq: Once | INTRAVENOUS | Status: AC
  Administered 2014-10-26: 1000 mL via INTRAVENOUS

## 2014-10-26 MED ORDER — CEFAZOLIN SODIUM-DEXTROSE 2-3 GM-% IV SOLR
2.0000 g | Freq: Three times a day (TID) | INTRAVENOUS | Status: DC
  Administered 2014-10-26 – 2014-11-03 (×23): 2 g via INTRAVENOUS
  Filled 2014-10-26 (×28): qty 50

## 2014-10-26 MED ORDER — PROPOFOL 1000 MG/100ML IV EMUL
5.0000 ug/kg/min | INTRAVENOUS | Status: DC
  Administered 2014-10-26: 80 ug/kg/min via INTRAVENOUS
  Administered 2014-10-26: 70 ug/kg/min via INTRAVENOUS
  Administered 2014-10-27 – 2014-10-28 (×3): 40 ug/kg/min via INTRAVENOUS
  Administered 2014-10-28: 45 ug/kg/min via INTRAVENOUS
  Administered 2014-10-29 (×2): 40 ug/kg/min via INTRAVENOUS
  Filled 2014-10-26 (×5): qty 100
  Filled 2014-10-26: qty 200

## 2014-10-26 MED ORDER — CEFAZOLIN SODIUM-DEXTROSE 2-3 GM-% IV SOLR
2.0000 g | Freq: Once | INTRAVENOUS | Status: AC
  Administered 2014-10-26: 2 g via INTRAVENOUS
  Filled 2014-10-26: qty 50

## 2014-10-26 MED ORDER — DOCUSATE SODIUM 50 MG/5ML PO LIQD
100.0000 mg | Freq: Two times a day (BID) | ORAL | Status: DC | PRN
  Filled 2014-10-26: qty 10

## 2014-10-26 MED ORDER — SODIUM CHLORIDE 0.9 % IV SOLN
Freq: Once | INTRAVENOUS | Status: AC
  Administered 2014-10-26: 22:00:00 via INTRAVENOUS

## 2014-10-26 MED ORDER — POTASSIUM CHLORIDE IN NACL 20-0.9 MEQ/L-% IV SOLN
INTRAVENOUS | Status: DC
  Administered 2014-10-26 – 2014-10-30 (×6): via INTRAVENOUS
  Filled 2014-10-26 (×13): qty 1000

## 2014-10-26 MED ORDER — LACTATED RINGERS IV SOLN
INTRAVENOUS | Status: DC | PRN
  Administered 2014-10-26: 14:00:00 via INTRAVENOUS

## 2014-10-26 MED ORDER — ROCURONIUM BROMIDE 50 MG/5ML IV SOLN
INTRAVENOUS | Status: AC | PRN
  Administered 2014-10-26: 100 mg via INTRAVENOUS

## 2014-10-26 MED ORDER — MIDAZOLAM HCL 5 MG/5ML IJ SOLN
INTRAMUSCULAR | Status: DC | PRN
  Administered 2014-10-26: 2 mg via INTRAVENOUS

## 2014-10-26 MED ORDER — VECURONIUM BROMIDE 10 MG IV SOLR
INTRAVENOUS | Status: AC
Start: 2014-10-26 — End: 2014-10-26
  Filled 2014-10-26: qty 10

## 2014-10-26 MED ORDER — ETOMIDATE 2 MG/ML IV SOLN
INTRAVENOUS | Status: AC | PRN
  Administered 2014-10-26: 30 mg via INTRAVENOUS

## 2014-10-26 MED ORDER — SODIUM CHLORIDE 0.9 % IV SOLN
25.0000 ug/h | INTRAVENOUS | Status: DC
  Administered 2014-10-27: 150 ug/h via INTRAVENOUS
  Administered 2014-10-27: 175 ug/h via INTRAVENOUS
  Filled 2014-10-26 (×3): qty 50

## 2014-10-26 MED ORDER — IOHEXOL 300 MG/ML  SOLN: 100.0000 mL | Freq: Once | INTRAMUSCULAR | Status: DC | PRN

## 2014-10-26 MED ORDER — FENTANYL CITRATE (PF) 100 MCG/2ML IJ SOLN: 50.0000 ug | Freq: Once | INTRAMUSCULAR | Status: DC

## 2014-10-26 MED ORDER — FENTANYL CITRATE (PF) 250 MCG/5ML IJ SOLN
INTRAMUSCULAR | Status: AC
  Filled 2014-10-26: qty 5

## 2014-10-26 MED ORDER — FENTANYL CITRATE (PF) 100 MCG/2ML IJ SOLN
INTRAMUSCULAR | Status: AC
  Filled 2014-10-26: qty 6

## 2014-10-26 MED ORDER — CHLORHEXIDINE GLUCONATE 0.12 % MT SOLN
15.0000 mL | Freq: Two times a day (BID) | OROMUCOSAL | Status: DC
  Administered 2014-10-26 – 2014-11-03 (×14): 15 mL via OROMUCOSAL
  Filled 2014-10-26 (×18): qty 15

## 2014-10-26 MED ORDER — MIDAZOLAM HCL 2 MG/2ML IJ SOLN: 2.0000 mg | INTRAMUSCULAR | Status: DC | PRN

## 2014-10-26 MED ORDER — SODIUM BICARBONATE 8.4 % IV SOLN
INTRAVENOUS | Status: DC | PRN
  Administered 2014-10-26 (×5): 50 meq via INTRAVENOUS

## 2014-10-26 MED ORDER — SODIUM BICARBONATE 8.4 % IV SOLN
INTRAVENOUS | Status: AC
  Filled 2014-10-26: qty 50

## 2014-10-26 MED ORDER — PHENYLEPHRINE HCL 10 MG/ML IJ SOLN
10.0000 mg | INTRAVENOUS | Status: DC | PRN
  Administered 2014-10-26: 40 ug/min via INTRAVENOUS

## 2014-10-26 MED ORDER — PROPOFOL 1000 MG/100ML IV EMUL
INTRAVENOUS | Status: AC
  Filled 2014-10-26: qty 100

## 2014-10-26 MED ORDER — ALBUMIN HUMAN 5 % IV SOLN
INTRAVENOUS | Status: DC | PRN
  Administered 2014-10-26 (×2): via INTRAVENOUS

## 2014-10-26 MED ORDER — MIDAZOLAM HCL 2 MG/2ML IJ SOLN
INTRAMUSCULAR | Status: AC
  Filled 2014-10-26: qty 2

## 2014-10-26 MED ORDER — CETYLPYRIDINIUM CHLORIDE 0.05 % MT LIQD: 7.0000 mL | Freq: Four times a day (QID) | OROMUCOSAL | Status: DC

## 2014-10-26 MED ORDER — FENTANYL CITRATE (PF) 100 MCG/2ML IJ SOLN
INTRAMUSCULAR | Status: AC
  Filled 2014-10-26: qty 2

## 2014-10-26 MED ORDER — CETYLPYRIDINIUM CHLORIDE 0.05 % MT LIQD
7.0000 mL | Freq: Four times a day (QID) | OROMUCOSAL | Status: DC
  Administered 2014-10-26 – 2014-11-04 (×18): 7 mL via OROMUCOSAL

## 2014-10-26 MED ORDER — PANTOPRAZOLE SODIUM 40 MG IV SOLR
40.0000 mg | Freq: Every day | INTRAVENOUS | Status: DC
  Administered 2014-10-26 – 2014-10-30 (×5): 40 mg via INTRAVENOUS
  Filled 2014-10-26 (×6): qty 40

## 2014-10-26 MED ORDER — PANTOPRAZOLE SODIUM 40 MG PO TBEC: 40.0000 mg | DELAYED_RELEASE_TABLET | Freq: Every day | ORAL | Status: DC

## 2014-10-26 MED ORDER — MIDAZOLAM HCL 2 MG/2ML IJ SOLN
2.0000 mg | INTRAMUSCULAR | Status: AC | PRN
  Administered 2014-10-26 (×2): 2 mg via INTRAVENOUS

## 2014-10-26 SURGICAL SUPPLY — 65 items
BIT DRILL LONG 4.0 (BIT) ×2 IMPLANT
BIT DRILL LONG 4.0MM (BIT) ×1 IMPLANT
BIT DRILL SHORT 4.0 (BIT) ×2 IMPLANT
BLADE 15 SAFETY STRL DISP (BLADE) ×3 IMPLANT
BNDG COHESIVE 4X5 TAN STRL (GAUZE/BANDAGES/DRESSINGS) ×4 IMPLANT
COVER PERINEAL POST (MISCELLANEOUS) ×4 IMPLANT
COVER SURGICAL LIGHT HANDLE (MISCELLANEOUS) ×4 IMPLANT
DRAPE C-ARM 42X72 X-RAY (DRAPES) ×4 IMPLANT
DRAPE C-ARMOR (DRAPES) ×4 IMPLANT
DRAPE INCISE IOBAN 66X45 STRL (DRAPES) ×4 IMPLANT
DRAPE INCISE IOBAN 85X60 (DRAPES) ×2 IMPLANT
DRAPE ORTHO SPLIT 77X108 STRL (DRAPES) ×16
DRAPE PROXIMA HALF (DRAPES) ×8 IMPLANT
DRAPE SURG ORHT 6 SPLT 77X108 (DRAPES) ×6 IMPLANT
DRAPE U-SHAPE 47X51 STRL (DRAPES) ×4 IMPLANT
DRILL BIT LONG 4.0 (BIT) ×8
DRILL BIT LONG 4.0MM (BIT) ×4
DRILL BIT SHORT 4.0 (BIT) ×8
DRILL STEP  6.4 (BIT) ×3 IMPLANT
DRSG EMULSION OIL 3X3 NADH (GAUZE/BANDAGES/DRESSINGS) ×4 IMPLANT
DRSG MEPILEX BORDER 4X4 (GAUZE/BANDAGES/DRESSINGS) ×14 IMPLANT
DRSG MEPILEX BORDER 4X8 (GAUZE/BANDAGES/DRESSINGS) ×4 IMPLANT
DRSG VAC ATS LRG SENSATRAC (GAUZE/BANDAGES/DRESSINGS) ×3 IMPLANT
DRSG VAC ATS MED SENSATRAC (GAUZE/BANDAGES/DRESSINGS) ×2 IMPLANT
DURAPREP 26ML APPLICATOR (WOUND CARE) ×4 IMPLANT
ELECT CAUTERY BLADE 6.4 (BLADE) ×4 IMPLANT
ELECT REM PT RETURN 9FT ADLT (ELECTROSURGICAL) ×4
ELECTRODE REM PT RTRN 9FT ADLT (ELECTROSURGICAL) ×2 IMPLANT
FACESHIELD WRAPAROUND (MASK) ×8 IMPLANT
FACESHIELD WRAPAROUND OR TEAM (MASK) ×4 IMPLANT
GAUZE XEROFORM 5X9 LF (GAUZE/BANDAGES/DRESSINGS) ×6 IMPLANT
GLOVE NEODERM STRL 7.5 LF PF (GLOVE) ×4 IMPLANT
GLOVE SURG NEODERM 7.5  LF PF (GLOVE) ×4
GOWN STRL REIN XL XLG (GOWN DISPOSABLE) ×16 IMPLANT
GUIDE PIN 3.2MM (MISCELLANEOUS) ×8
GUIDE PIN ORTH 343X3.2XBRAD (MISCELLANEOUS) IMPLANT
GUIDE ROD 3.0 (MISCELLANEOUS) ×8
KIT BASIN OR (CUSTOM PROCEDURE TRAY) ×4 IMPLANT
KIT ROOM TURNOVER OR (KITS) ×4 IMPLANT
LINER BOOT UNIVERSAL DISP (MISCELLANEOUS) ×4 IMPLANT
MANIFOLD NEPTUNE II (INSTRUMENTS) ×4 IMPLANT
NAIL FEMORAL 10X40MM (Nail) ×2 IMPLANT
NAIL RETROGRADE 10X40 (Nail) ×2 IMPLANT
NS IRRIG 1000ML POUR BTL (IV SOLUTION) ×4 IMPLANT
PACK GENERAL/GYN (CUSTOM PROCEDURE TRAY) ×4 IMPLANT
PAD ARMBOARD 7.5X6 YLW CONV (MISCELLANEOUS) ×8 IMPLANT
PAD NEG PRESSURE SENSATRAC (MISCELLANEOUS) ×3 IMPLANT
PENCIL BUTTON BLDE SNGL 10FT (ELECTRODE) ×2 IMPLANT
ROD GUIDE 3.0 (MISCELLANEOUS) IMPLANT
SCREW 6.4X100 (Screw) ×4 IMPLANT
SCREW TRIGEN LOW PROF 5.0X35 (Screw) ×6 IMPLANT
SCREW TRIGEN LOW PROF 5.0X45 (Screw) ×2 IMPLANT
SCREW TRIGEN LOW PROF 5.0X50 (Screw) ×3 IMPLANT
SCREW TRIGEN LOW PROF 5.0X55 (Screw) ×2 IMPLANT
SCREW TRIGEN LOW PROF 5.0X75 (Screw) ×2 IMPLANT
SPONGE LAP 18X18 X RAY DECT (DISPOSABLE) ×2 IMPLANT
STAPLER VISISTAT 35W (STAPLE) ×4 IMPLANT
STOCKINETTE IMPERVIOUS 9X36 MD (GAUZE/BANDAGES/DRESSINGS) ×4 IMPLANT
SUT VIC AB 0 CT1 27 (SUTURE) ×4
SUT VIC AB 0 CT1 27XBRD ANBCTR (SUTURE) ×2 IMPLANT
SUT VIC AB 0 CTX 36 (SUTURE) ×12
SUT VIC AB 0 CTX36XBRD ANTBCTR (SUTURE) IMPLANT
SUT VIC AB 2-0 CT1 27 (SUTURE) ×16
SUT VIC AB 2-0 CT1 TAPERPNT 27 (SUTURE) ×4 IMPLANT
WATER STERILE IRR 1000ML POUR (IV SOLUTION) ×8 IMPLANT

## 2014-10-26 NOTE — ED Notes (Signed)
2nd unit infusing [id number]

## 2014-10-26 NOTE — Progress Notes (Signed)
Pt admitted to 3M07 from OR.  Report received.  See ICU flowsheet for vs and assessment.  Trauma notified of pt arrival.

## 2014-10-26 NOTE — Anesthesia Postprocedure Evaluation (Signed)
Anesthesia Post Note  Patient: Lance Cline  Procedure(s) Performed: Procedure(s) (LRB): LEFT AND RIGHT IM NAIL (Bilateral) INCISION AND DRAINAGE OF LEFT HIP/THIGH WOUND (Left)  Anesthesia type: General  Patient location: ICU  Post pain: Pain level controlled  Post assessment: Post-op Vital signs reviewed  Last Vitals:  Filed Vitals:   10/26/14 2020  BP: 92/33  Pulse: 107  Temp:   Resp:     Post vital signs: stable  Level of consciousness: Patient remains intubated per anesthesia plan  Complications: No apparent anesthesia complications
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
## 2014-10-26 NOTE — Transfer of Care (Signed)
Immediate Anesthesia Transfer of Care Note  Patient: Lance Cline  Procedure(s) Performed: Procedure(s): LEFT AND RIGHT IM NAIL (Bilateral) INCISION AND DRAINAGE OF LEFT HIP/THIGH WOUND (Left)  Patient Location: SICU  Anesthesia Type:General  Level of Consciousness: sedated and unresponsive  Airway & Oxygen Therapy: Patient remains intubated per anesthesia plan and Patient placed on Ventilator (see vital sign flow sheet for setting)  Post-op Assessment: Report given to RN and Post -op Vital signs reviewed and stable  Post vital signs: Reviewed and stable  Last Vitals:  Filed Vitals:   10/26/14 1823  BP:   Pulse:   Temp: 36.5 C  Resp:     Complications: No apparent anesthesia complications
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
## 2014-10-26 NOTE — ED Notes (Signed)
Radiology called.  
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
## 2014-10-26 NOTE — ED Notes (Signed)
1st unit PRBC's [id number]
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
## 2014-10-26 NOTE — ED Notes (Signed)
Ortho, Dr Fayrene FearingXiu, at bedside.

## 2014-10-26 NOTE — Progress Notes (Signed)
Chaplain present upon pt arrival. No family present initially. When family arrived chaplain escorted them to consultation room "A." Chaplain facilitated communication between medical team, [employer], and family. Chaplain also spoke with pt at bedside, informed him of family arrival and provided emotional support. Family able to see pt briefly before pt sent to OR. [facility]. Chaplain offered emotional support, beverages, and word of prayer. More family may be arriving shortly, page chaplain as needed.    10/26/14 1400  Clinical Encounter Type  Visited With Patient and family together;Health care provider  Visit Type Trauma;ED;Spiritual support  Referral From Nurse  Consult/Referral To Chaplain  Spiritual Encounters  Spiritual Needs Emotional;Prayer  Stress Factors  Family Stress Factors Lack of knowledge;Health changes  Tami Blass, Loa SocksCourtney F, Chaplain 10/26/2014 2:27 PM

## 2014-10-26 NOTE — ED Notes (Signed)
Patient Lab results I-Stat CG4 of 5.12 reported to Dr.Pickering.
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
## 2014-10-26 NOTE — ED Notes (Signed)
To ct with rn and rt

## 2014-10-26 NOTE — Anesthesia Preprocedure Evaluation (Addendum)
Anesthesia Evaluation  Patient identified by MRN, date of birth, ID band Patient unresponsive    Reviewed: Unable to perform ROS - Chart review onlyPreop documentation limited or incomplete due to emergent nature of procedure.  Airway Mallampati: Intubated       Dental   Pulmonary    Pulmonary exam normal       Cardiovascular Normal cardiovascular exam    Neuro/Psych    GI/Hepatic   Endo/Other    Renal/GU      Musculoskeletal   Abdominal   Peds  Hematology   Anesthesia Other Findings   Reproductive/Obstetrics                            Anesthesia Physical Anesthesia Plan  ASA: III and emergent  Anesthesia Plan: General   Post-op Pain Management:    Induction: Intravenous  Airway Management Planned: Oral ETT  Additional Equipment:   Intra-op Plan:   Post-operative Plan: Post-operative intubation/ventilation  Informed Consent: I have reviewed the patients History and Physical, chart, labs and discussed the procedure including the risks, benefits and alternatives for the proposed anesthesia with the patient or authorized representative who has indicated his/her understanding and acceptance.     Plan Discussed with: CRNA and Surgeon  Anesthesia Plan Comments:         Anesthesia Quick Evaluation
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
## 2014-10-26 NOTE — ED Notes (Signed)
Pharmacy called to order penicillin and gent

## 2014-10-26 NOTE — ED Notes (Signed)
1st and 2nd units of FFP started

## 2014-10-27 ENCOUNTER — Inpatient Hospital Stay (HOSPITAL_COMMUNITY): Payer: Self-pay

## 2014-10-27 ENCOUNTER — Inpatient Hospital Stay (HOSPITAL_COMMUNITY): Payer: MEDICAID

## 2014-10-27 ENCOUNTER — Encounter (HOSPITAL_COMMUNITY): Payer: Self-pay | Admitting: *Deleted

## 2014-10-27 LAB — PREPARE PLATELET PHERESIS: Unit division: 0

## 2014-10-27 LAB — CBC
HCT: 27.6 % — ABNORMAL LOW (ref 39.0–52.0)
Hemoglobin: 9.7 g/dL — ABNORMAL LOW (ref 13.0–17.0)
MCH: 29.7 pg (ref 26.0–34.0)
MCHC: 35.1 g/dL (ref 30.0–36.0)
MCV: 84.4 fL (ref 78.0–100.0)
PLATELETS: 152 10*3/uL (ref 150–400)
RBC: 3.27 MIL/uL — AB (ref 4.22–5.81)
RDW: 15 % (ref 11.5–15.5)
WBC: 6.8 10*3/uL (ref 4.0–10.5)

## 2014-10-27 LAB — PREPARE FRESH FROZEN PLASMA
UNIT DIVISION: 0
Unit division: 0

## 2014-10-27 LAB — COMPREHENSIVE METABOLIC PANEL
ALK PHOS: 38 U/L (ref 38–126)
ALT: 56 U/L (ref 17–63)
AST: 149 U/L — ABNORMAL HIGH (ref 15–41)
Albumin: 2.5 g/dL — ABNORMAL LOW (ref 3.5–5.0)
Anion gap: 8 (ref 5–15)
BUN: 7 mg/dL (ref 6–20)
CO2: 25 mmol/L (ref 22–32)
Calcium: 6.6 mg/dL — ABNORMAL LOW (ref 8.9–10.3)
Chloride: 107 mmol/L (ref 101–111)
Creatinine, Ser: 1.23 mg/dL (ref 0.61–1.24)
GFR calc Af Amer: >60 mL/min (ref >60)
GFR calc non Af Amer: >60 mL/min (ref >60)
GLUCOSE: 255 mg/dL — AB (ref 70–99)
POTASSIUM: 4.3 mmol/L (ref 3.5–5.1)
SODIUM: 140 mmol/L (ref 135–145)
Total Bilirubin: 1.1 mg/dL (ref 0.3–1.2)
Total Protein: 4.4 g/dL — ABNORMAL LOW (ref 6.5–8.1)

## 2014-10-27 LAB — PREPARE RBC (CROSSMATCH)

## 2014-10-27 LAB — GLUCOSE, CAPILLARY
GLUCOSE-CAPILLARY: 198 mg/dL — AB (ref 70–99)
GLUCOSE-CAPILLARY: 195 mg/dL — AB (ref 70–99)
Glucose-Capillary: 251 mg/dL — ABNORMAL HIGH (ref 70–99)

## 2014-10-27 LAB — PROTIME-INR
INR: 1.2 (ref 0.00–1.49)
PROTHROMBIN TIME: 15.3 s — AB (ref 11.6–15.2)

## 2014-10-27 LAB — GENTAMICIN LEVEL, RANDOM: Gentamicin Rm: 6.2 ug/mL

## 2014-10-27 MED ORDER — GENTAMICIN SULFATE 40 MG/ML IJ SOLN
7.0000 mg/kg | INTRAVENOUS | Status: DC
  Administered 2014-10-28 – 2014-10-29 (×2): 620 mg via INTRAVENOUS
  Filled 2014-10-27 (×3): qty 15.5

## 2014-10-27 MED ORDER — INSULIN ASPART 100 UNIT/ML ~~LOC~~ SOLN: 0.0000 [IU] | Freq: Every day | SUBCUTANEOUS | Status: DC

## 2014-10-27 MED ORDER — INSULIN ASPART 100 UNIT/ML ~~LOC~~ SOLN
0.0000 [IU] | Freq: Three times a day (TID) | SUBCUTANEOUS | Status: DC
  Administered 2014-10-27: 8 [IU] via SUBCUTANEOUS
  Administered 2014-10-27 – 2014-10-28 (×4): 3 [IU] via SUBCUTANEOUS

## 2014-10-27 NOTE — Procedures (Signed)
**Note De-Identified Itai Barbian Obfuscation** Extubation Procedure Note  Patient Details:   Name: Lance Cline DOB: 1976/03/20 MRN: [id number]   Airway Documentation:     Evaluation  O2 sats: stable throughout Complications: No apparent complications Patient did tolerate procedure well. Bilateral Breath Sounds: Clear, Diminished Suctioning: Oral Yes + leak, no stridor noted Ralonda Tartt, Megan SalonWendy Cooper 10/27/2014, 9:02 AM
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
## 2014-10-27 NOTE — Plan of Care (Signed)
Problem: Phase I Progression Outcomes Goal: Post op hemodynamically stable Outcome: Progressing Liter bolus adm for hypotension
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
## 2014-10-28 ENCOUNTER — Inpatient Hospital Stay (HOSPITAL_COMMUNITY): Payer: Self-pay

## 2014-10-28 ENCOUNTER — Encounter (HOSPITAL_COMMUNITY): Payer: Self-pay | Admitting: Certified Registered Nurse Anesthetist

## 2014-10-28 ENCOUNTER — Inpatient Hospital Stay (HOSPITAL_COMMUNITY): Payer: MEDICAID | Admitting: Certified Registered Nurse Anesthetist

## 2014-10-28 ENCOUNTER — Inpatient Hospital Stay (HOSPITAL_COMMUNITY): Payer: Self-pay | Admitting: Certified Registered Nurse Anesthetist

## 2014-10-28 ENCOUNTER — Encounter (HOSPITAL_COMMUNITY): Admission: EM | Disposition: A | Discharge status: Rehabilitation Facility | Payer: Self-pay | Source: Home / Self Care

## 2014-10-28 HISTORY — PX: I&D EXTREMITY: SHX5045

## 2014-10-28 LAB — CBC WITH DIFFERENTIAL/PLATELET
BASOS PCT: 0 % (ref 0–1)
Basophils Absolute: 0 10*3/uL (ref 0.0–0.1)
EOS ABS: 0 10*3/uL (ref 0.0–0.7)
Eosinophils Relative: 0 % (ref 0–5)
HCT: 28.6 % — ABNORMAL LOW (ref 39.0–52.0)
Hemoglobin: 9.7 g/dL — ABNORMAL LOW (ref 13.0–17.0)
Lymphocytes Relative: 16 % (ref 12–46)
Lymphs Abs: 1.2 10*3/uL (ref 0.7–4.0)
MCH: 28.8 pg (ref 26.0–34.0)
MCHC: 33.9 g/dL (ref 30.0–36.0)
MCV: 84.9 fL (ref 78.0–100.0)
MONOS PCT: 6 % (ref 3–12)
Monocytes Absolute: 0.5 10*3/uL (ref 0.1–1.0)
NEUTROS ABS: 5.9 10*3/uL (ref 1.7–7.7)
NEUTROS PCT: 77 % (ref 43–77)
PLATELETS: 114 10*3/uL — AB (ref 150–400)
RBC: 3.37 MIL/uL — ABNORMAL LOW (ref 4.22–5.81)
RDW: 15.1 % (ref 11.5–15.5)
WBC: 7.6 10*3/uL (ref 4.0–10.5)

## 2014-10-28 LAB — POCT I-STAT 7, (LYTES, BLD GAS, ICA,H+H)
BICARBONATE: 25.9 meq/L — AB (ref 20.0–24.0)
Calcium, Ion: 1.07 mmol/L — ABNORMAL LOW (ref 1.12–1.23)
HCT: 23 % — ABNORMAL LOW (ref 39.0–52.0)
Hemoglobin: 7.8 g/dL — ABNORMAL LOW (ref 13.0–17.0)
O2 Saturation: 99 %
Patient temperature: 37.9
Potassium: 4.4 mmol/L (ref 3.5–5.1)
Sodium: 134 mmol/L — ABNORMAL LOW (ref 135–145)
TCO2: 27 mmol/L (ref 0–100)
pCO2 arterial: 49.5 mmHg — ABNORMAL HIGH (ref 35.0–45.0)
pH, Arterial: 7.331 — ABNORMAL LOW (ref 7.350–7.450)
pO2, Arterial: 143 mmHg — ABNORMAL HIGH (ref 80.0–100.0)

## 2014-10-28 LAB — BASIC METABOLIC PANEL
Anion gap: 6 (ref 5–15)
BUN: 6 mg/dL (ref 6–20)
CO2: 29 mmol/L (ref 22–32)
CREATININE: 1.13 mg/dL (ref 0.61–1.24)
Calcium: 7 mg/dL — ABNORMAL LOW (ref 8.9–10.3)
Chloride: 99 mmol/L — ABNORMAL LOW (ref 101–111)
GFR calc Af Amer: >60 mL/min (ref >60)
GFR calc non Af Amer: >60 mL/min (ref >60)
Glucose, Bld: 171 mg/dL — ABNORMAL HIGH (ref 70–99)
Potassium: 4.6 mmol/L (ref 3.5–5.1)
Sodium: 134 mmol/L — ABNORMAL LOW (ref 135–145)

## 2014-10-28 LAB — POCT I-STAT 3, ART BLOOD GAS (G3+)
ACID-BASE DEFICIT: 2 mmol/L (ref 0.0–2.0)
Bicarbonate: 23.8 mEq/L (ref 20.0–24.0)
O2 Saturation: 100 %
PO2 ART: 299 mmHg — AB (ref 80.0–100.0)
TCO2: 25 mmol/L (ref 0–100)
pCO2 arterial: 46.3 mmHg — ABNORMAL HIGH (ref 35.0–45.0)
pH, Arterial: 7.319 — ABNORMAL LOW (ref 7.350–7.450)

## 2014-10-28 LAB — APTT: aPTT: 37 seconds (ref 24–37)

## 2014-10-28 LAB — LACTIC ACID, PLASMA: LACTIC ACID, VENOUS: 0.9 mmol/L (ref 0.5–2.0)

## 2014-10-28 LAB — GLUCOSE, CAPILLARY
GLUCOSE-CAPILLARY: 144 mg/dL — AB (ref 70–99)
GLUCOSE-CAPILLARY: 183 mg/dL — AB (ref 70–99)
Glucose-Capillary: 181 mg/dL — ABNORMAL HIGH (ref 70–99)
Glucose-Capillary: 175 mg/dL — ABNORMAL HIGH (ref 70–99)
Glucose-Capillary: 129 mg/dL — ABNORMAL HIGH (ref 70–99)

## 2014-10-28 LAB — CBC
HCT: 27.2 % — ABNORMAL LOW (ref 39.0–52.0)
Hemoglobin: 9 g/dL — ABNORMAL LOW (ref 13.0–17.0)
MCH: 28.9 pg (ref 26.0–34.0)
MCHC: 33.1 g/dL (ref 30.0–36.0)
MCV: 87.5 fL (ref 78.0–100.0)
PLATELETS: 144 10*3/uL — AB (ref 150–400)
RBC: 3.11 MIL/uL — AB (ref 4.22–5.81)
RDW: 15.5 % (ref 11.5–15.5)
WBC: 9.8 10*3/uL (ref 4.0–10.5)

## 2014-10-28 LAB — PROTIME-INR
INR: 1.18 (ref 0.00–1.49)
Prothrombin Time: 15.2 seconds (ref 11.6–15.2)

## 2014-10-28 LAB — PREPARE RBC (CROSSMATCH)

## 2014-10-28 LAB — HEMOGLOBIN A1C
HEMOGLOBIN A1C: 7.5 % — AB (ref 4.8–5.6)
Mean Plasma Glucose: 169 mg/dL

## 2014-10-28 SURGERY — IRRIGATION AND DEBRIDEMENT EXTREMITY
Anesthesia: General | Site: Leg Upper | Laterality: Left

## 2014-10-28 MED ORDER — PROPOFOL INFUSION 10 MG/ML OPTIME
INTRAVENOUS | Status: DC | PRN
  Administered 2014-10-28: 75 ug/kg/min via INTRAVENOUS

## 2014-10-28 MED ORDER — MIDAZOLAM HCL 5 MG/5ML IJ SOLN
INTRAMUSCULAR | Status: DC | PRN
  Administered 2014-10-28: 2 mg via INTRAVENOUS

## 2014-10-28 MED ORDER — ROCURONIUM BROMIDE 100 MG/10ML IV SOLN
INTRAVENOUS | Status: DC | PRN
  Administered 2014-10-28: 50 mg via INTRAVENOUS

## 2014-10-28 MED ORDER — VECURONIUM BROMIDE 10 MG IV SOLR
INTRAVENOUS | Status: DC | PRN
  Administered 2014-10-28 (×2): 5 mg via INTRAVENOUS

## 2014-10-28 MED ORDER — PROPOFOL 10 MG/ML IV BOLUS
INTRAVENOUS | Status: DC | PRN
  Administered 2014-10-28: 150 mg via INTRAVENOUS

## 2014-10-28 MED ORDER — MIDAZOLAM HCL 2 MG/2ML IJ SOLN
INTRAMUSCULAR | Status: AC
  Filled 2014-10-28: qty 2

## 2014-10-28 MED ORDER — SODIUM CHLORIDE 0.9 % IR SOLN
Status: DC | PRN
  Administered 2014-10-28: 3000 mL

## 2014-10-28 MED ORDER — LIDOCAINE HCL (CARDIAC) 20 MG/ML IV SOLN
INTRAVENOUS | Status: DC | PRN
  Administered 2014-10-28: 70 mg via INTRAVENOUS

## 2014-10-28 MED ORDER — FENTANYL CITRATE (PF) 250 MCG/5ML IJ SOLN
INTRAMUSCULAR | Status: AC
  Filled 2014-10-28: qty 5

## 2014-10-28 MED ORDER — PHENYLEPHRINE HCL 10 MG/ML IJ SOLN
INTRAMUSCULAR | Status: DC | PRN
  Administered 2014-10-28: 80 ug via INTRAVENOUS

## 2014-10-28 MED ORDER — INSULIN ASPART 100 UNIT/ML ~~LOC~~ SOLN
0.0000 [IU] | SUBCUTANEOUS | Status: DC
Start: 2014-10-29 — End: 2014-10-29
  Administered 2014-10-29 (×2): 2 [IU] via SUBCUTANEOUS

## 2014-10-28 MED ORDER — SODIUM CHLORIDE 0.9 % IV SOLN: 10.0000 mL/h | Freq: Once | INTRAVENOUS | Status: DC

## 2014-10-28 MED ORDER — CEFAZOLIN SODIUM-DEXTROSE 2-3 GM-% IV SOLR
INTRAVENOUS | Status: DC | PRN
  Administered 2014-10-28: 2 g via INTRAVENOUS

## 2014-10-28 MED ORDER — LACTATED RINGERS IV SOLN
INTRAVENOUS | Status: DC
  Administered 2014-10-28 (×2): via INTRAVENOUS

## 2014-10-28 SURGICAL SUPPLY — 91 items
APPLIER CLIP 11 MED OPEN (CLIP)
APR CLP MED 11 20 MLT OPN (CLIP)
BANDAGE ELASTIC 4 VELCRO ST LF (GAUZE/BANDAGES/DRESSINGS) ×3 IMPLANT
BLADE SURG 10 STRL SS (BLADE) ×1 IMPLANT
BLADE SURG ROTATE 9660 (MISCELLANEOUS) ×4 IMPLANT
BNDG CMPR MED 10X6 ELC LF (GAUZE/BANDAGES/DRESSINGS) ×2
BNDG COHESIVE 4X5 TAN STRL (GAUZE/BANDAGES/DRESSINGS) ×4 IMPLANT
BNDG ELASTIC 6X10 VLCR STRL LF (GAUZE/BANDAGES/DRESSINGS) ×4 IMPLANT
BNDG GAUZE ELAST 4 BULKY (GAUZE/BANDAGES/DRESSINGS) ×2 IMPLANT
BNDG GAUZE STRTCH 6 (GAUZE/BANDAGES/DRESSINGS) ×12 IMPLANT
BRUSH SCRUB DISP (MISCELLANEOUS) ×8 IMPLANT
CANISTER WOUND CARE 500ML ATS (WOUND CARE) ×4 IMPLANT
CLIP APPLIE 11 MED OPEN (CLIP) IMPLANT
CLOSURE WOUND 1/2 X4 (GAUZE/BANDAGES/DRESSINGS) ×1
COVER SURGICAL LIGHT HANDLE (MISCELLANEOUS) ×8 IMPLANT
DRAIN CHANNEL 10F 3/8 F FF (DRAIN) IMPLANT
DRAIN CHANNEL 15F RND FF W/TCR (WOUND CARE) ×1 IMPLANT
DRAPE C-ARM 42X72 X-RAY (DRAPES) ×4 IMPLANT
DRAPE C-ARMOR (DRAPES) ×4 IMPLANT
DRAPE IMP U-DRAPE 54X76 (DRAPES) ×4 IMPLANT
DRAPE INCISE IOBAN 66X45 STRL (DRAPES) ×8 IMPLANT
DRAPE INCISE IOBAN 85X60 (DRAPES) ×8 IMPLANT
DRAPE LAPAROTOMY TRNSV 102X78 (DRAPE) ×8 IMPLANT
DRAPE ORTHO SPLIT 77X108 STRL (DRAPES) ×8
DRAPE SURG ORHT 6 SPLT 77X108 (DRAPES) ×4 IMPLANT
DRAPE U-SHAPE 47X51 STRL (DRAPES) ×8 IMPLANT
DRSG ADAPTIC 3X8 NADH LF (GAUZE/BANDAGES/DRESSINGS) ×4 IMPLANT
DRSG MEPILEX BORDER 4X12 (GAUZE/BANDAGES/DRESSINGS) IMPLANT
DRSG MEPILEX BORDER 4X8 (GAUZE/BANDAGES/DRESSINGS) IMPLANT
DRSG MEPITEL 4X7.2 (GAUZE/BANDAGES/DRESSINGS) ×3 IMPLANT
DRSG PAD ABDOMINAL 8X10 ST (GAUZE/BANDAGES/DRESSINGS) ×5 IMPLANT
DRSG VAC ATS LRG SENSATRAC (GAUZE/BANDAGES/DRESSINGS) ×12 IMPLANT
ELECT BLADE 6.5 EXT (BLADE) IMPLANT
ELECT CAUTERY BLADE 6.4 (BLADE) IMPLANT
ELECT REM PT RETURN 9FT ADLT (ELECTROSURGICAL) ×4
ELECTRODE REM PT RTRN 9FT ADLT (ELECTROSURGICAL) ×2 IMPLANT
EVACUATOR 1/8 PVC DRAIN (DRAIN) IMPLANT
EVACUATOR SILICONE 100CC (DRAIN) ×4 IMPLANT
GAUZE SPONGE 4X4 12PLY STRL (GAUZE/BANDAGES/DRESSINGS) IMPLANT
GAUZE SPONGE 4X4 16PLY XRAY LF (GAUZE/BANDAGES/DRESSINGS) ×4 IMPLANT
GLOVE BIO SURGEON STRL SZ7.5 (GLOVE) ×4 IMPLANT
GLOVE BIO SURGEON STRL SZ8 (GLOVE) ×4 IMPLANT
GLOVE BIOGEL PI IND STRL 7.5 (GLOVE) ×2 IMPLANT
GLOVE BIOGEL PI IND STRL 8 (GLOVE) ×2 IMPLANT
GLOVE BIOGEL PI INDICATOR 7.5 (GLOVE) ×2
GLOVE BIOGEL PI INDICATOR 8 (GLOVE) ×2
GOWN STRL REUS W/ TWL LRG LVL3 (GOWN DISPOSABLE) ×4 IMPLANT
GOWN STRL REUS W/ TWL XL LVL3 (GOWN DISPOSABLE) ×2 IMPLANT
GOWN STRL REUS W/TWL 2XL LVL3 (GOWN DISPOSABLE) ×4 IMPLANT
GOWN STRL REUS W/TWL LRG LVL3 (GOWN DISPOSABLE) ×8
GOWN STRL REUS W/TWL XL LVL3 (GOWN DISPOSABLE) ×4
HANDPIECE INTERPULSE COAX TIP (DISPOSABLE)
KIT BASIN OR (CUSTOM PROCEDURE TRAY) ×4 IMPLANT
KIT ROOM TURNOVER OR (KITS) ×4 IMPLANT
LIGHT ORTHO (MISCELLANEOUS) ×1 IMPLANT
LOOP VESSEL MAXI BLUE (MISCELLANEOUS) IMPLANT
MANIFOLD NEPTUNE II (INSTRUMENTS) ×4 IMPLANT
NDL MAYO TROCAR (NEEDLE) ×1 IMPLANT
NEEDLE MAYO TROCAR (NEEDLE) IMPLANT
NS IRRIG 1000ML POUR BTL (IV SOLUTION) ×8 IMPLANT
PACK ORTHO EXTREMITY (CUSTOM PROCEDURE TRAY) ×4 IMPLANT
PACK TOTAL JOINT (CUSTOM PROCEDURE TRAY) IMPLANT
PACK UNIVERSAL I (CUSTOM PROCEDURE TRAY) ×4 IMPLANT
PAD ARMBOARD 7.5X6 YLW CONV (MISCELLANEOUS) ×8 IMPLANT
PADDING CAST COTTON 6X4 STRL (CAST SUPPLIES) ×4 IMPLANT
RETRIEVER SUT HEWSON (MISCELLANEOUS) IMPLANT
SET HNDPC FAN SPRY TIP SCT (DISPOSABLE) IMPLANT
SPONGE LAP 18X18 X RAY DECT (DISPOSABLE) ×12 IMPLANT
STAPLER VISISTAT 35W (STAPLE) ×4 IMPLANT
STOCKINETTE IMPERVIOUS 9X36 MD (GAUZE/BANDAGES/DRESSINGS) ×1 IMPLANT
STOCKINETTE IMPERVIOUS LG (DRAPES) ×3 IMPLANT
STRIP CLOSURE SKIN 1/2X4 (GAUZE/BANDAGES/DRESSINGS) ×3 IMPLANT
SUCTION FRAZIER TIP 10 FR DISP (SUCTIONS) ×1 IMPLANT
SUT FIBERWIRE #2 38 T-5 BLUE (SUTURE)
SUT PDS AB 2-0 CT1 27 (SUTURE) IMPLANT
SUT VIC AB 0 CT1 27 (SUTURE) ×16
SUT VIC AB 0 CT1 27XBRD ANBCTR (SUTURE) ×8 IMPLANT
SUT VIC AB 1 CT1 18XCR BRD 8 (SUTURE) ×4 IMPLANT
SUT VIC AB 1 CT1 8-18 (SUTURE) ×8
SUT VIC AB 2-0 CT1 27 (SUTURE) ×16
SUT VIC AB 2-0 CT1 TAPERPNT 27 (SUTURE) ×8 IMPLANT
SUTURE FIBERWR #2 38 T-5 BLUE (SUTURE) IMPLANT
TOWEL OR 17X24 6PK STRL BLUE (TOWEL DISPOSABLE) ×4 IMPLANT
TOWEL OR 17X26 10 PK STRL BLUE (TOWEL DISPOSABLE) ×8 IMPLANT
TRAY FOLEY CATH 16FRSI W/METER (SET/KITS/TRAYS/PACK) IMPLANT
TUBE ANAEROBIC SPECIMEN COL (MISCELLANEOUS) IMPLANT
TUBE CONNECTING 12'X1/4 (SUCTIONS) ×1
TUBE CONNECTING 12X1/4 (SUCTIONS) ×3 IMPLANT
UNDERPAD 30X30 INCONTINENT (UNDERPADS AND DIAPERS) ×4 IMPLANT
WATER STERILE IRR 1000ML POUR (IV SOLUTION) ×16 IMPLANT
YANKAUER SUCT BULB TIP NO VENT (SUCTIONS) ×4 IMPLANT

## 2014-10-28 NOTE — Progress Notes (Signed)
UR completed.  Await completion of surgery and weight bearing restrictions to determine pt's best d/c dispo. CM to cont to follow.  Carlyle LipaMichelle Selam Pietsch, RN BSN MHA CCM Trauma/Neuro ICU Case Manager [phone]
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
## 2014-10-28 NOTE — Transfer of Care (Signed)
Immediate Anesthesia Transfer of Care Note  Patient: Lance Cline  Procedure(s) Performed: Procedure(s): IRRIGATION AND DEBRIDEMENT EXTREMITY WITH VAC (Left)  Patient Location: ICU  Anesthesia Type:General  Level of Consciousness: sedated, unresponsive and Patient remains intubated per anesthesia plan  Airway & Oxygen Therapy: Patient remains intubated per anesthesia plan and Patient placed on Ventilator (see vital sign flow sheet for setting)  Post-op Assessment: Report given to RN and Post -op Vital signs reviewed and stable  Post vital signs: Reviewed and stable  Last Vitals:  Filed Vitals:   10/28/14 1606  BP:   Pulse: 107  Temp:   Resp: 18    Complications: No apparent anesthesia complications

## 2014-10-28 NOTE — Progress Notes (Signed)
   Subjective:  Patient is resting comfortably  Objective:   VITALS:   Filed Vitals:   10/28/14 0600 10/28/14 0700 10/28/14 0730 10/28/14 0759  BP: 126/67 114/60 127/51   Pulse: 127 122 126   Temp:    100.2 F (37.9 C)  TempSrc:    Axillary  Resp: 27 22 24    Height:      Weight:      SpO2: 97% 99% 99%     Dressings c/d/i Compartments soft VAC in place   Lab Results  Component Value Date   WBC 9.8 10/28/2014   HGB 9.0* 10/28/2014   HCT 27.2* 10/28/2014   MCV 87.5 10/28/2014   PLT 144* 10/28/2014     Assessment/Plan:  2 Days Post-Op   - NWB BLE - to OR with Dr. Carola FrostHandy today  Lance Cline, Lance Cline 10/28/2014, 8:49 AM [phone]

## 2014-10-28 NOTE — Progress Notes (Signed)
Chaplain briefly followed up with pt family in waiting area. Pt family requested that chaplain continue praying for pt. Chaplain will continue to follow. Cleva Camero, Mayer MaskerCourtney F, Chaplain 10/28/2014 10:53 AM

## 2014-10-28 NOTE — Progress Notes (Signed)
No changes today, parents at bedside.  Have discussed risks, benefits, and obtained consent.  Myrene GalasMichael Donaven Criswell, MD [facility] [phone] (p)

## 2014-10-28 NOTE — Progress Notes (Signed)
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 LEFT HIP/THIGH WOUND NPO for the OR today  Blood typed and cross matched. Mild tachycardia, on fentanyl drip.  LOS: 2 days   Marta Lamas. Gae Bon, MD, FACS [phone] Trauma Surgeon 10/28/2014

## 2014-10-28 NOTE — Anesthesia Preprocedure Evaluation (Signed)
Anesthesia Evaluation  Patient identified by MRN, date of birth, ID band Patient awake    Reviewed: Allergy & Precautions, NPO status , Patient's Chart, lab work & pertinent test results  Airway Mallampati: II  TM Distance: >3 FB Neck ROM: Full    Dental   Pulmonary Current Smoker,    Pulmonary exam normal       Cardiovascular Normal cardiovascular exam    Neuro/Psych    GI/Hepatic   Endo/Other    Renal/GU      Musculoskeletal   Abdominal   Peds  Hematology   Anesthesia Other Findings   Reproductive/Obstetrics                             Anesthesia Physical Anesthesia Plan  ASA: III  Anesthesia Plan: General   Post-op Pain Management:    Induction: Intravenous  Airway Management Planned: Oral ETT  Additional Equipment:   Intra-op Plan:   Post-operative Plan: Possible Post-op intubation/ventilation  Informed Consent: I have reviewed the patients History and Physical, chart, labs and discussed the procedure including the risks, benefits and alternatives for the proposed anesthesia with the patient or authorized representative who has indicated his/her understanding and acceptance.     Plan Discussed with: CRNA and Surgeon  Anesthesia Plan Comments:         Anesthesia Quick Evaluation

## 2014-10-28 NOTE — Progress Notes (Addendum)
Pt received from the unit placed to heart monitor. Family at bedside. Contact lens removed from left eye per pt and family's request. States to throw away the lens, it is disposable.

## 2014-10-28 NOTE — Anesthesia Procedure Notes (Signed)
Procedure Name: Intubation Date/Time: 10/28/2014 1:29 PM Performed by: Adonis HousekeeperNGELL, Jasen Hartstein M Pre-anesthesia Checklist: Patient identified, Emergency Drugs available, Suction available and Patient being monitored Patient Re-evaluated:Patient Re-evaluated prior to inductionOxygen Delivery Method: Circle system utilized Preoxygenation: Pre-oxygenation with 100% oxygen Intubation Type: IV induction Ventilation: Mask ventilation without difficulty and Oral airway inserted - appropriate to patient size Laryngoscope Size: Glidescope and 4 Grade View: Grade I Tube type: Oral Tube size: 7.0 mm Number of attempts: 1 Airway Equipment and Method: Stylet and Video-laryngoscopy Placement Confirmation: ETT inserted through vocal cords under direct vision,  positive ETCO2 and breath sounds checked- equal and bilateral Secured at: 22 cm Tube secured with: Tape Dental Injury: Teeth and Oropharynx as per pre-operative assessment

## 2014-10-29 ENCOUNTER — Inpatient Hospital Stay (HOSPITAL_COMMUNITY): Payer: Self-pay

## 2014-10-29 ENCOUNTER — Encounter (HOSPITAL_COMMUNITY): Payer: Self-pay | Admitting: Orthopedic Surgery

## 2014-10-29 DIAGNOSIS — J9600 Acute respiratory failure, unspecified whether with hypoxia or hypercapnia: Secondary | ICD-10-CM | POA: Diagnosis not present

## 2014-10-29 DIAGNOSIS — S3282XA Multiple fractures of pelvis without disruption of pelvic ring, initial encounter for closed fracture: Secondary | ICD-10-CM | POA: Diagnosis present

## 2014-10-29 DIAGNOSIS — S82402A Unspecified fracture of shaft of left fibula, initial encounter for closed fracture: Secondary | ICD-10-CM | POA: Diagnosis present

## 2014-10-29 DIAGNOSIS — IMO0002 Reserved for concepts with insufficient information to code with codable children: Secondary | ICD-10-CM

## 2014-10-29 DIAGNOSIS — D62 Acute posthemorrhagic anemia: Secondary | ICD-10-CM | POA: Diagnosis not present

## 2014-10-29 LAB — BASIC METABOLIC PANEL
Anion gap: 7 (ref 5–15)
BUN: 9 mg/dL (ref 6–20)
CHLORIDE: 104 mmol/L (ref 101–111)
CO2: 25 mmol/L (ref 22–32)
CREATININE: 1.13 mg/dL (ref 0.61–1.24)
Calcium: 7.3 mg/dL — ABNORMAL LOW (ref 8.9–10.3)
GFR calc Af Amer: >60 mL/min (ref >60)
GFR calc non Af Amer: >60 mL/min (ref >60)
Glucose, Bld: 157 mg/dL — ABNORMAL HIGH (ref 70–99)
POTASSIUM: 4.1 mmol/L (ref 3.5–5.1)
Sodium: 136 mmol/L (ref 135–145)

## 2014-10-29 LAB — CBC WITH DIFFERENTIAL/PLATELET
BASOS ABS: 0 10*3/uL (ref 0.0–0.1)
Basophils Relative: 0 % (ref 0–1)
EOS PCT: 1 % (ref 0–5)
Eosinophils Absolute: 0.1 10*3/uL (ref 0.0–0.7)
HEMATOCRIT: 25.4 % — AB (ref 39.0–52.0)
Hemoglobin: 8.6 g/dL — ABNORMAL LOW (ref 13.0–17.0)
Lymphocytes Relative: 17 % (ref 12–46)
Lymphs Abs: 1.2 10*3/uL (ref 0.7–4.0)
MCH: 28.7 pg (ref 26.0–34.0)
MCHC: 33.9 g/dL (ref 30.0–36.0)
MCV: 84.7 fL (ref 78.0–100.0)
MONO ABS: 0.7 10*3/uL (ref 0.1–1.0)
Monocytes Relative: 9 % (ref 3–12)
NEUTROS ABS: 5.4 10*3/uL (ref 1.7–7.7)
Neutrophils Relative %: 73 % (ref 43–77)
Platelets: 120 10*3/uL — ABNORMAL LOW (ref 150–400)
RBC: 3 MIL/uL — ABNORMAL LOW (ref 4.22–5.81)
RDW: 15.1 % (ref 11.5–15.5)
WBC: 7.5 10*3/uL (ref 4.0–10.5)

## 2014-10-29 LAB — GLUCOSE, CAPILLARY
Glucose-Capillary: 140 mg/dL — ABNORMAL HIGH (ref 70–99)
Glucose-Capillary: 154 mg/dL — ABNORMAL HIGH (ref 70–99)

## 2014-10-29 MED ORDER — DIPHENHYDRAMINE HCL 12.5 MG/5ML PO ELIX
12.5000 mg | ORAL_SOLUTION | Freq: Four times a day (QID) | ORAL | Status: DC | PRN
  Administered 2014-10-29 – 2014-10-30 (×2): 12.5 mg via ORAL
  Filled 2014-10-29 (×4): qty 5

## 2014-10-29 MED ORDER — DIPHENHYDRAMINE HCL 50 MG/ML IJ SOLN
12.5000 mg | Freq: Four times a day (QID) | INTRAMUSCULAR | Status: DC | PRN
  Administered 2014-10-30 (×2): 12.5 mg via INTRAVENOUS
  Filled 2014-10-29 (×2): qty 1

## 2014-10-29 MED ORDER — ENOXAPARIN SODIUM 30 MG/0.3ML ~~LOC~~ SOLN
30.0000 mg | Freq: Two times a day (BID) | SUBCUTANEOUS | Status: DC
  Administered 2014-10-29 – 2014-11-19 (×37): 30 mg via SUBCUTANEOUS
  Filled 2014-10-29 (×44): qty 0.3

## 2014-10-29 MED ORDER — POLYETHYLENE GLYCOL 3350 17 G PO PACK
17.0000 g | PACK | Freq: Every day | ORAL | Status: DC
  Administered 2014-10-29 – 2014-11-08 (×6): 17 g via ORAL
  Filled 2014-10-29 (×8): qty 1

## 2014-10-29 MED ORDER — SODIUM CHLORIDE 0.9 % IJ SOLN: 9.0000 mL | INTRAMUSCULAR | Status: DC | PRN

## 2014-10-29 MED ORDER — DOCUSATE SODIUM 100 MG PO CAPS
100.0000 mg | ORAL_CAPSULE | Freq: Two times a day (BID) | ORAL | Status: DC
  Administered 2014-10-29 – 2014-11-19 (×33): 100 mg via ORAL
  Filled 2014-10-29 (×41): qty 1

## 2014-10-29 MED ORDER — NALOXONE HCL 0.4 MG/ML IJ SOLN: 0.4000 mg | INTRAMUSCULAR | Status: DC | PRN

## 2014-10-29 MED ORDER — HYDROMORPHONE 0.3 MG/ML IV SOLN
INTRAVENOUS | Status: DC
  Administered 2014-10-29 (×2): via INTRAVENOUS
  Administered 2014-10-30: 2.7 mg via INTRAVENOUS
  Administered 2014-10-30: 04:00:00 via INTRAVENOUS
  Administered 2014-10-30: 5.38 mg via INTRAVENOUS
  Filled 2014-10-29 (×3): qty 25

## 2014-10-29 NOTE — Progress Notes (Signed)
Orthopaedic Trauma Service Progress Note  Subjective  Doing ok Sore    ROS As above   Objective   BP 116/65 mmHg  Pulse 108  Temp(Src) 99.6 F (37.6 C) (Oral)  Resp 23  Ht 5\' 11"  (1.803 m)  Wt 109.5 kg (241 lb 6.5 oz)  BMI 33.68 kg/m2  SpO2 100%  Intake/Output      05/09 0701 - 05/10 0700 05/10 0701 - 05/11 0700   P.O.     I.V. (mL/kg) 3715 (33.9) 430.5 (3.9)   Blood 670    IV Piggyback 215.5    Total Intake(mL/kg) 4600.5 (42) 430.5 (3.9)   Urine (mL/kg/hr) 3475 (1.3) 600 (0.8)   Drains 700 (0.3) 300 (0.4)   Total Output 4175 900   Net +425.5 -469.5          Labs  Results for Dola FactorEAGUE, Kasean (MRN [id number]) as of 10/29/2014 14:01  Ref. Range 10/29/2014 05:30  WBC Latest Ref Range: 4.0-10.5 K/uL 7.5  RBC Latest Ref Range: 4.22-5.81 MIL/uL 3.00 (L)  Hemoglobin Latest Ref Range: 13.0-17.0 g/dL 8.6 (L)  HCT Latest Ref Range: 39.0-52.0 % 25.4 (L)  MCV Latest Ref Range: 78.0-100.0 fL 84.7  MCH Latest Ref Range: 26.0-34.0 pg 28.7  MCHC Latest Ref Range: 30.0-36.0 g/dL 40.933.9  RDW Latest Ref Range: 11.5-15.5 % 15.1  Platelets Latest Ref Range: 150-400 K/uL 120 (L)    Exam  Gen: lying in bed sleeping, arouses easily  Ext:       Left Lower Extremity   Vac functioning  Dressings stable  Knee immobilizer stable    Ext warm  + DP pulse  No DCT  DPN, SPN, TN sensation intact  EHL, FHL, AT, PT, peroneals, gastroc motor intact    Assessment and Plan   POD/HD#: 1   39 [facility]   1. B femur fractures s/p IMN     L anterior column, anterior wall acetabulum fracture     CM pelvic ring fracture, L side     Complex degloving injury L thigh    Return to OR Thursday for repeat I&D of L thigh  Do not anticipate fixation of his acetabulum and pelvis at that time  Continue bedrest for now  Remains at increased risk for complications related to L leg soft tissue injury, particularly infection   2. Pain management:  PCA  3.  ABL anemia/Hemodynamics  Stable for now  Will likely need blood prior to additional procedures  CBC in am  4. Medical issues   Per TS  5. DVT/PE prophylaxis:  Lovenox  6. ID:   Continue with ancef, gent until soft tissue debridements completed and muscle covered   7. Dispo:  Return to OR thurs  Limb threatening injury   Mearl LatinKeith W. Tenika Keeran, PA-C [facility] [phone](P) [phone] (O) 10/29/2014 2:00 PM
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
## 2014-10-29 NOTE — Procedures (Signed)
Extubation Procedure Note  Patient Details:   Name: Lance Cline DOB: [birth year] MRN: [id number]   Airway Documentation:     Evaluation  O2 sats: stable throughout Complications: No apparent complications Patient did tolerate procedure well. Bilateral Breath Sounds: Clear Suctioning: Airway Yes.  Extubated per Dr.'s orders to 4L [place].  Pt doing well no complications  [name] V 10/29/2014, 7:52 AM

## 2014-10-30 LAB — TYPE AND SCREEN
ABO/RH(D): AB POS
Antibody Screen: NEGATIVE
UNIT DIVISION: 0
UNIT DIVISION: 0
UNIT DIVISION: 0
Unit division: 0
Unit division: 0
Unit division: 0
Unit division: 0
Unit division: 0
Unit division: 0

## 2014-10-30 LAB — CBC
HCT: 25.8 % — ABNORMAL LOW (ref 39.0–52.0)
HEMOGLOBIN: 8.8 g/dL — AB (ref 13.0–17.0)
MCH: 29 pg (ref 26.0–34.0)
MCHC: 34.1 g/dL (ref 30.0–36.0)
MCV: 85.1 fL (ref 78.0–100.0)
Platelets: 153 10*3/uL (ref 150–400)
RBC: 3.03 MIL/uL — ABNORMAL LOW (ref 4.22–5.81)
RDW: 14.5 % (ref 11.5–15.5)
WBC: 9.2 10*3/uL (ref 4.0–10.5)

## 2014-10-30 LAB — BLOOD PRODUCT ORDER (VERBAL) VERIFICATION

## 2014-10-30 MED ORDER — METHOCARBAMOL 1000 MG/10ML IJ SOLN
1000.0000 mg | Freq: Three times a day (TID) | INTRAVENOUS | Status: DC
  Administered 2014-10-30 – 2014-11-03 (×13): 1000 mg via INTRAVENOUS
  Filled 2014-10-30 (×17): qty 10

## 2014-10-30 MED ORDER — ACETAMINOPHEN 325 MG PO TABS
ORAL_TABLET | ORAL | Status: AC
  Filled 2014-10-30: qty 2

## 2014-10-30 MED ORDER — HYDROMORPHONE HCL 1 MG/ML IJ SOLN
0.5000 mg | INTRAMUSCULAR | Status: DC | PRN
  Administered 2014-10-30: 1 mg via INTRAVENOUS
  Administered 2014-10-30 – 2014-10-31 (×3): 2 mg via INTRAVENOUS
  Administered 2014-10-31: 1 mg via INTRAVENOUS
  Administered 2014-10-31: 2 mg via INTRAVENOUS
  Filled 2014-10-30 (×3): qty 2
  Filled 2014-10-30: qty 1
  Filled 2014-10-30 (×3): qty 2
  Filled 2014-10-30: qty 1

## 2014-10-30 MED ORDER — SODIUM CHLORIDE 0.9 % IV SOLN: Freq: Once | INTRAVENOUS | Status: DC

## 2014-10-30 MED ORDER — FENTANYL 25 MCG/HR TD PT72
25.0000 ug | MEDICATED_PATCH | TRANSDERMAL | Status: DC
  Administered 2014-10-30 – 2014-11-02 (×2): 25 ug via TRANSDERMAL
  Filled 2014-10-30 (×2): qty 1

## 2014-10-30 MED ORDER — ACETAMINOPHEN 325 MG PO TABS
650.0000 mg | ORAL_TABLET | Freq: Four times a day (QID) | ORAL | Status: DC | PRN
  Administered 2014-10-30 – 2014-11-18 (×3): 650 mg via ORAL
  Filled 2014-10-30 (×3): qty 2

## 2014-10-30 MED ORDER — ACETAMINOPHEN 325 MG PO TABS: 650.0000 mg | ORAL_TABLET | Freq: Four times a day (QID) | ORAL | Status: DC | PRN

## 2014-10-30 MED ORDER — ONDANSETRON HCL 4 MG/2ML IJ SOLN
4.0000 mg | Freq: Three times a day (TID) | INTRAMUSCULAR | Status: DC | PRN
  Administered 2014-10-31 – 2014-11-06 (×3): 4 mg via INTRAVENOUS
  Filled 2014-10-30 (×2): qty 2
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
## 2014-10-30 NOTE — Progress Notes (Signed)
Orthopaedic Trauma Service Progress Note  Subjective  Doing ok Family at bedside Seems to be comfortable at this time   ROS As above   Objective   BP 119/75 mmHg  Pulse 109  Temp(Src) 99.9 F (37.7 C) (Oral)  Resp 23  Ht 5\' 11"  (1.803 m)  Wt 109.5 kg (241 lb 6.5 oz)  BMI 33.68 kg/m2  SpO2 98%  Intake/Output      05/10 0701 - 05/11 0700 05/11 0701 - 05/12 0700   P.O. 400    I.V. (mL/kg) 1430.5 (13.1) 50 (0.5)   Blood     IV Piggyback 265.5    Total Intake(mL/kg) 2096 (19.1) 50 (0.5)   Urine (mL/kg/hr) 4700 (1.8)    Drains 860 (0.3)    Total Output 5560     Net -3464 +50          Labs  Results for Dola FactorEAGUE, Lance (MRN [id number]) as of 10/30/2014 10:00  Ref. Range 10/30/2014 02:25  WBC Latest Ref Range: 4.0-10.5 K/uL 9.2  RBC Latest Ref Range: 4.22-5.81 MIL/uL 3.03 (L)  Hemoglobin Latest Ref Range: 13.0-17.0 g/dL 8.8 (L)  HCT Latest Ref Range: 39.0-52.0 % 25.8 (L)  MCV Latest Ref Range: 78.0-100.0 fL 85.1  MCH Latest Ref Range: 26.0-34.0 pg 29.0  MCHC Latest Ref Range: 30.0-36.0 g/dL 40.934.1  RDW Latest Ref Range: 11.5-15.5 % 14.5  Platelets Latest Ref Range: 150-400 K/uL 153    Exam  Gen: lying in bed sleeping, awake, NAD  Ext:        Left Lower Extremity               Vac functioning             Dressings stable             Knee immobilizer stable                      Ext warm             + DP pulse             No DCT             DPN, SPN, TN sensation intact             EHL, FHL, AT, PT, peroneals, gastroc motor intact               Assessment and Plan   POD/HD#: 2   39 [facility]   1. B femur fractures s/p IMN     L anterior column, anterior wall acetabulum fracture     CM pelvic ring fracture, L side     Complex degloving injury L thigh               Return to OR tomorrow for repeat I&D of L thigh             Do not anticipate fixation of his acetabulum and pelvis at that time but may do it if soft tissue  looks exceptionally well              Continue bedrest for now             Remains at increased risk for complications related to L leg soft tissue injury, particularly infection    Does not need to have CAM boot on L leg   2. Pain management:  PCA, fentanyl patch  Add robaxin   3. ABL anemia/Hemodynamics             Stable for now             Will likely need blood prior to additional procedures  Will type and cross for 2 units to have on hold              CBC in am  4. Medical issues               Per TS  5. DVT/PE prophylaxis:             Lovenox  6. ID:               Continue with ancef, gent until soft tissue debridements completed and muscle covered    Will have a better sense of gent duration after tomorrows I&D   7. Dispo:             Return to OR tomorrow              Limb threatening injury     Mearl LatinKeith W. Delynn Olvera, PA-C [facility] [phone](P) [phone] (O) 10/30/2014 10:00 AM

## 2014-10-30 NOTE — Progress Notes (Signed)
Orthopedic Tech Progress Note Patient Details:  Lance Cline May 24, 1976 [id number]  Ortho Devices Type of Ortho Device: CAM walker Ortho Device/Splint Location: trapeze bar patient helper Ortho Device/Splint Interventions: Application   Lance Cline 10/30/2014, 11:55 AM

## 2014-10-30 NOTE — Progress Notes (Signed)
Report called to RN on 5N, patient transported to room.

## 2014-10-30 NOTE — Progress Notes (Signed)
UR completed.  Pt extubated yesterday. To return to OR tomorrow. CM to continue to follow and identify d/c needs.   Carlyle LipaMichelle Edvin Albus, RN BSN MHA CCM Trauma/Neuro ICU Case Manager [phone]

## 2014-10-30 NOTE — Progress Notes (Signed)
Patient stable in bed getting a sponge bath Surgical dressings c/d/i NWB BLE To OR with Handy tomorrow  N. Glee ArvinMichael Kadey Mihalic, MD [facility] [phone] 12:54 PM
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
## 2014-10-31 ENCOUNTER — Inpatient Hospital Stay (HOSPITAL_COMMUNITY): Payer: Self-pay | Admitting: Anesthesiology

## 2014-10-31 ENCOUNTER — Inpatient Hospital Stay (HOSPITAL_COMMUNITY): Payer: MEDICAID | Admitting: Anesthesiology

## 2014-10-31 ENCOUNTER — Encounter (HOSPITAL_COMMUNITY): Payer: Self-pay | Admitting: Anesthesiology

## 2014-10-31 ENCOUNTER — Encounter (HOSPITAL_COMMUNITY): Admission: EM | Disposition: A | Discharge status: Rehabilitation Facility | Payer: Self-pay | Source: Home / Self Care

## 2014-10-31 HISTORY — PX: I&D EXTREMITY: SHX5045

## 2014-10-31 LAB — GRAM STAIN

## 2014-10-31 SURGERY — IRRIGATION AND DEBRIDEMENT EXTREMITY
Anesthesia: General | Site: Thigh | Laterality: Left

## 2014-10-31 MED ORDER — HYDROMORPHONE HCL 1 MG/ML IJ SOLN
0.2500 mg | INTRAMUSCULAR | Status: DC | PRN
  Administered 2014-10-31 (×4): 0.5 mg via INTRAVENOUS

## 2014-10-31 MED ORDER — FENTANYL CITRATE (PF) 250 MCG/5ML IJ SOLN
INTRAMUSCULAR | Status: AC
  Filled 2014-10-31: qty 5

## 2014-10-31 MED ORDER — OXYCODONE HCL 5 MG PO TABS
5.0000 mg | ORAL_TABLET | ORAL | Status: DC | PRN
  Administered 2014-10-31: 10 mg via ORAL
  Administered 2014-10-31: 15 mg via ORAL
  Administered 2014-10-31: 10 mg via ORAL
  Administered 2014-11-01 – 2014-11-04 (×17): 15 mg via ORAL
  Filled 2014-10-31 (×5): qty 3
  Filled 2014-10-31: qty 2
  Filled 2014-10-31 (×10): qty 3
  Filled 2014-10-31: qty 2
  Filled 2014-10-31 (×3): qty 3

## 2014-10-31 MED ORDER — LIDOCAINE HCL (CARDIAC) 20 MG/ML IV SOLN
INTRAVENOUS | Status: DC | PRN
  Administered 2014-10-31: 60 mg via INTRAVENOUS

## 2014-10-31 MED ORDER — ROCURONIUM BROMIDE 100 MG/10ML IV SOLN
INTRAVENOUS | Status: DC | PRN
  Administered 2014-10-31: 50 mg via INTRAVENOUS

## 2014-10-31 MED ORDER — FENTANYL CITRATE (PF) 100 MCG/2ML IJ SOLN
INTRAMUSCULAR | Status: DC | PRN
  Administered 2014-10-31: 50 ug via INTRAVENOUS
  Administered 2014-10-31: 100 ug via INTRAVENOUS

## 2014-10-31 MED ORDER — PHENYLEPHRINE HCL 10 MG/ML IJ SOLN
INTRAMUSCULAR | Status: DC | PRN
  Administered 2014-10-31 (×5): 80 ug via INTRAVENOUS
  Administered 2014-10-31: 120 ug via INTRAVENOUS

## 2014-10-31 MED ORDER — PROPOFOL 10 MG/ML IV BOLUS
INTRAVENOUS | Status: AC
  Filled 2014-10-31: qty 20

## 2014-10-31 MED ORDER — SODIUM CHLORIDE 0.9 % IV SOLN
25.0000 mg | Freq: Three times a day (TID) | INTRAVENOUS | Status: DC | PRN
  Administered 2014-10-31 – 2014-11-01 (×2): 25 mg via INTRAVENOUS
  Filled 2014-10-31 (×5): qty 1

## 2014-10-31 MED ORDER — ONDANSETRON HCL 4 MG/2ML IJ SOLN
INTRAMUSCULAR | Status: AC
  Filled 2014-10-31: qty 2

## 2014-10-31 MED ORDER — MIDAZOLAM HCL 2 MG/2ML IJ SOLN
INTRAMUSCULAR | Status: AC
  Filled 2014-10-31: qty 2

## 2014-10-31 MED ORDER — HYDROMORPHONE HCL 1 MG/ML IJ SOLN
INTRAMUSCULAR | Status: AC
  Administered 2014-10-31: 0.5 mg via INTRAVENOUS
  Filled 2014-10-31: qty 1

## 2014-10-31 MED ORDER — PHENYLEPHRINE 40 MCG/ML (10ML) SYRINGE FOR IV PUSH (FOR BLOOD PRESSURE SUPPORT)
PREFILLED_SYRINGE | INTRAVENOUS | Status: AC
  Filled 2014-10-31: qty 10

## 2014-10-31 MED ORDER — NEOSTIGMINE METHYLSULFATE 10 MG/10ML IV SOLN
INTRAVENOUS | Status: DC | PRN
  Administered 2014-10-31: 3 mg via INTRAVENOUS

## 2014-10-31 MED ORDER — GENTAMICIN SULFATE 40 MG/ML IJ SOLN
7.0000 mg/kg | INTRAVENOUS | Status: DC
  Administered 2014-11-01 – 2014-11-03 (×3): 620 mg via INTRAVENOUS
  Filled 2014-10-31 (×4): qty 15.5

## 2014-10-31 MED ORDER — LACTATED RINGERS IV SOLN
INTRAVENOUS | Status: DC | PRN
  Administered 2014-10-31 (×2): via INTRAVENOUS

## 2014-10-31 MED ORDER — MIDAZOLAM HCL 5 MG/5ML IJ SOLN
INTRAMUSCULAR | Status: DC | PRN
  Administered 2014-10-31: 2 mg via INTRAVENOUS

## 2014-10-31 MED ORDER — GLYCOPYRROLATE 0.2 MG/ML IJ SOLN
INTRAMUSCULAR | Status: DC | PRN
  Administered 2014-10-31: 0.4 mg via INTRAVENOUS

## 2014-10-31 MED ORDER — SODIUM CHLORIDE 0.9 % IR SOLN
Status: DC | PRN
  Administered 2014-10-31: 3000 mL

## 2014-10-31 MED ORDER — PROPOFOL 10 MG/ML IV BOLUS
INTRAVENOUS | Status: DC | PRN
  Administered 2014-10-31: 140 mg via INTRAVENOUS

## 2014-10-31 MED ORDER — HYDROMORPHONE HCL 1 MG/ML IJ SOLN
0.5000 mg | INTRAMUSCULAR | Status: DC | PRN
  Administered 2014-11-01 – 2014-11-06 (×12): 0.5 mg via INTRAVENOUS
  Filled 2014-10-31 (×12): qty 1

## 2014-10-31 SURGICAL SUPPLY — 88 items
APPLIER CLIP 11 MED OPEN (CLIP)
APR CLP MED 11 20 MLT OPN (CLIP)
BLADE SURG 10 STRL SS (BLADE) ×4 IMPLANT
BLADE SURG ROTATE 9660 (MISCELLANEOUS) IMPLANT
BNDG COHESIVE 4X5 TAN STRL (GAUZE/BANDAGES/DRESSINGS) ×4 IMPLANT
BNDG GAUZE ELAST 4 BULKY (GAUZE/BANDAGES/DRESSINGS) ×8 IMPLANT
BNDG GAUZE STRTCH 6 (GAUZE/BANDAGES/DRESSINGS) ×12 IMPLANT
BRUSH SCRUB DISP (MISCELLANEOUS) ×8 IMPLANT
CLIP APPLIE 11 MED OPEN (CLIP) IMPLANT
CLOSURE WOUND 1/2 X4 (GAUZE/BANDAGES/DRESSINGS) ×1
COVER SURGICAL LIGHT HANDLE (MISCELLANEOUS) ×8 IMPLANT
DRAIN CHANNEL 10F 3/8 F FF (DRAIN) IMPLANT
DRAIN CHANNEL 15F RND FF W/TCR (WOUND CARE) ×4 IMPLANT
DRAPE C-ARM 42X72 X-RAY (DRAPES) ×4 IMPLANT
DRAPE C-ARMOR (DRAPES) ×4 IMPLANT
DRAPE IMP U-DRAPE 54X76 (DRAPES) ×4 IMPLANT
DRAPE INCISE IOBAN 66X45 STRL (DRAPES) ×8 IMPLANT
DRAPE INCISE IOBAN 85X60 (DRAPES) ×8 IMPLANT
DRAPE LAPAROTOMY TRNSV 102X78 (DRAPE) ×8 IMPLANT
DRAPE ORTHO SPLIT 77X108 STRL (DRAPES) ×8
DRAPE SURG ORHT 6 SPLT 77X108 (DRAPES) ×4 IMPLANT
DRAPE U-SHAPE 47X51 STRL (DRAPES) ×8 IMPLANT
DRSG ADAPTIC 3X8 NADH LF (GAUZE/BANDAGES/DRESSINGS) ×4 IMPLANT
DRSG MEPILEX BORDER 4X12 (GAUZE/BANDAGES/DRESSINGS) IMPLANT
DRSG MEPILEX BORDER 4X8 (GAUZE/BANDAGES/DRESSINGS) IMPLANT
DRSG MEPITEL 4X7.2 (GAUZE/BANDAGES/DRESSINGS) ×3 IMPLANT
DRSG PAD ABDOMINAL 8X10 ST (GAUZE/BANDAGES/DRESSINGS) ×8 IMPLANT
DRSG VAC ATS LRG SENSATRAC (GAUZE/BANDAGES/DRESSINGS) ×4 IMPLANT
DRSG VAC ATS MED SENSATRAC (GAUZE/BANDAGES/DRESSINGS) ×3 IMPLANT
DRSG VERSA FOAM LRG 10X15 (GAUZE/BANDAGES/DRESSINGS) ×6 IMPLANT
ELECT BLADE 6.5 EXT (BLADE) IMPLANT
ELECT CAUTERY BLADE 6.4 (BLADE) IMPLANT
ELECT REM PT RETURN 9FT ADLT (ELECTROSURGICAL) ×4
ELECTRODE REM PT RTRN 9FT ADLT (ELECTROSURGICAL) ×2 IMPLANT
EVACUATOR 1/8 PVC DRAIN (DRAIN) IMPLANT
EVACUATOR SILICONE 100CC (DRAIN) ×4 IMPLANT
GAUZE SPONGE 4X4 12PLY STRL (GAUZE/BANDAGES/DRESSINGS) ×8 IMPLANT
GAUZE SPONGE 4X4 16PLY XRAY LF (GAUZE/BANDAGES/DRESSINGS) ×4 IMPLANT
GLOVE BIO SURGEON STRL SZ7.5 (GLOVE) ×4 IMPLANT
GLOVE BIO SURGEON STRL SZ8 (GLOVE) ×4 IMPLANT
GLOVE BIOGEL PI IND STRL 7.5 (GLOVE) ×2 IMPLANT
GLOVE BIOGEL PI IND STRL 8 (GLOVE) ×2 IMPLANT
GLOVE BIOGEL PI INDICATOR 7.5 (GLOVE) ×2
GLOVE BIOGEL PI INDICATOR 8 (GLOVE) ×2
GOWN STRL REUS W/ TWL LRG LVL3 (GOWN DISPOSABLE) ×4 IMPLANT
GOWN STRL REUS W/ TWL XL LVL3 (GOWN DISPOSABLE) ×2 IMPLANT
GOWN STRL REUS W/TWL 2XL LVL3 (GOWN DISPOSABLE) ×4 IMPLANT
GOWN STRL REUS W/TWL LRG LVL3 (GOWN DISPOSABLE) ×8
GOWN STRL REUS W/TWL XL LVL3 (GOWN DISPOSABLE) ×4
HANDPIECE INTERPULSE COAX TIP (DISPOSABLE)
KIT BASIN OR (CUSTOM PROCEDURE TRAY) ×4 IMPLANT
KIT ROOM TURNOVER OR (KITS) ×4 IMPLANT
LIGHT ORTHO (MISCELLANEOUS) ×4 IMPLANT
LOOP VESSEL MAXI BLUE (MISCELLANEOUS) IMPLANT
MANIFOLD NEPTUNE II (INSTRUMENTS) ×4 IMPLANT
NDL MAYO TROCAR (NEEDLE) ×1 IMPLANT
NEEDLE MAYO TROCAR (NEEDLE) ×4 IMPLANT
NS IRRIG 1000ML POUR BTL (IV SOLUTION) ×8 IMPLANT
PACK ORTHO EXTREMITY (CUSTOM PROCEDURE TRAY) ×4 IMPLANT
PACK TOTAL JOINT (CUSTOM PROCEDURE TRAY) ×4 IMPLANT
PACK UNIVERSAL I (CUSTOM PROCEDURE TRAY) ×4 IMPLANT
PAD ARMBOARD 7.5X6 YLW CONV (MISCELLANEOUS) ×8 IMPLANT
PADDING CAST COTTON 6X4 STRL (CAST SUPPLIES) ×4 IMPLANT
RETRIEVER SUT HEWSON (MISCELLANEOUS) IMPLANT
SET HNDPC FAN SPRY TIP SCT (DISPOSABLE) IMPLANT
SPONGE LAP 18X18 X RAY DECT (DISPOSABLE) ×12 IMPLANT
STAPLER VISISTAT 35W (STAPLE) ×4 IMPLANT
STOCKINETTE IMPERVIOUS 9X36 MD (GAUZE/BANDAGES/DRESSINGS) ×4 IMPLANT
STRIP CLOSURE SKIN 1/2X4 (GAUZE/BANDAGES/DRESSINGS) ×3 IMPLANT
SUCTION FRAZIER TIP 10 FR DISP (SUCTIONS) ×4 IMPLANT
SUT FIBERWIRE #2 38 T-5 BLUE (SUTURE)
SUT PDS AB 2-0 CT1 27 (SUTURE) IMPLANT
SUT VIC AB 0 CT1 27 (SUTURE) ×16
SUT VIC AB 0 CT1 27XBRD ANBCTR (SUTURE) ×8 IMPLANT
SUT VIC AB 1 CT1 18XCR BRD 8 (SUTURE) ×4 IMPLANT
SUT VIC AB 1 CT1 8-18 (SUTURE) ×8
SUT VIC AB 2-0 CT1 27 (SUTURE) ×16
SUT VIC AB 2-0 CT1 TAPERPNT 27 (SUTURE) ×8 IMPLANT
SUTURE FIBERWR #2 38 T-5 BLUE (SUTURE) IMPLANT
TOWEL OR 17X24 6PK STRL BLUE (TOWEL DISPOSABLE) ×4 IMPLANT
TOWEL OR 17X26 10 PK STRL BLUE (TOWEL DISPOSABLE) ×8 IMPLANT
TRAY FOLEY CATH 16FRSI W/METER (SET/KITS/TRAYS/PACK) IMPLANT
TUBE ANAEROBIC SPECIMEN COL (MISCELLANEOUS) IMPLANT
TUBE CONNECTING 12'X1/4 (SUCTIONS) ×1
TUBE CONNECTING 12X1/4 (SUCTIONS) ×3 IMPLANT
UNDERPAD 30X30 INCONTINENT (UNDERPADS AND DIAPERS) ×4 IMPLANT
WATER STERILE IRR 1000ML POUR (IV SOLUTION) ×16 IMPLANT
YANKAUER SUCT BULB TIP NO VENT (SUCTIONS) ×4 IMPLANT

## 2014-10-31 NOTE — Anesthesia Procedure Notes (Signed)
Procedure Name: Intubation Date/Time: 10/31/2014 8:24 AM Performed by: Rudi RummageLOWDER, Carollynn Pennywell J Pre-anesthesia Checklist: Emergency Drugs available, Patient identified, Timeout performed, Suction available and Patient being monitored Patient Re-evaluated:Patient Re-evaluated prior to inductionOxygen Delivery Method: Circle system utilized Preoxygenation: Pre-oxygenation with 100% oxygen Intubation Type: IV induction Ventilation: Mask ventilation without difficulty Laryngoscope Size: Glidescope Tube type: Oral Tube size: 7.5 mm Number of attempts: 1 Placement Confirmation: ETT inserted through vocal cords under direct vision,  breath sounds checked- equal and bilateral and positive ETCO2 Secured at: 21 cm Tube secured with: Tape

## 2014-10-31 NOTE — Progress Notes (Signed)
I discussed with the patient the risks and benefits of surgery for his left thigh and acetabulum, including the possibility of infection, nerve injury, vessel injury, wound breakdown, arthritis, symptomatic hardware, DVT/ PE, loss of motion, and need for further surgery among others.  We also specifically discussed the need to stage surgery because of the soft tissue injury's severity.  He understood these risks and wished to proceed.  Myrene GalasMichael Mathius Birkeland, MD [facility] [phone] (p)

## 2014-10-31 NOTE — Transfer of Care (Signed)
Immediate Anesthesia Transfer of Care Note  Patient: Lance Cline  Procedure(s) Performed: Procedure(s): IRRIGATION AND DEBRIDEMENT EXTREMITY (Left)  Patient Location: PACU  Anesthesia Type:General  Level of Consciousness: awake  Airway & Oxygen Therapy: Patient Spontanous Breathing and Patient connected to nasal cannula oxygen  Post-op Assessment: Report given to RN and Post -op Vital signs reviewed and stable  Post vital signs: Reviewed and stable  Last Vitals:  Filed Vitals:   10/31/14 0538  BP: 113/57  Pulse: 107  Temp: 37.9 C  Resp:     Complications: No apparent anesthesia complications

## 2014-10-31 NOTE — Brief Op Note (Signed)
10/26/2014 - 10/31/2014  10:04 AM  PATIENT:  Lance Cline  39 y.o. male  PRE-OPERATIVE DIAGNOSIS:   1. OPEN WOUND LEFT THIGH 2. Pelvic and acetabular fractureS  POST-OPERATIVE DIAGNOSIS:   1. OPEN WOUND LEFT THIGH, further soft tissue necrosis 2. Pelvic and acetabular fractures  PROCEDURE:  Procedure(s): 1. EXCISIONAL DEBRIDEMENT EXTREMITY (Left) 2. Wound vac 45cm x 35cm  SURGEON:  Surgeon(s) and Role:    * Myrene GalasMichael Ellyce Lafevers, MD - Primary  PHYSICIAN ASSISTANT: [name], PA-C  ANESTHESIA:   general  I/O:  Total I/O In: 1000 [I.V.:1000] Out: 600 [Urine:600]  SPECIMEN:  Source of Specimen:  necrotic fat left hip  DISPOSITION OF SPECIMEN:  To micro  TOURNIQUET:  * No tourniquets in log *  DICTATION: .Other Dictation: Dictation Number [id number]

## 2014-10-31 NOTE — Anesthesia Preprocedure Evaluation (Addendum)
Anesthesia Evaluation  Patient identified by MRN, date of birth, ID band Patient awake    Reviewed: Allergy & Precautions, H&P , NPO status , Patient's Chart, lab work & pertinent test results  Airway Mallampati: II  TM Distance: >3 FB Neck ROM: Full    Dental no notable dental hx. (+) Teeth Intact, Dental Advisory Given   Pulmonary Current Smoker,  breath sounds clear to auscultation  Pulmonary exam normal       Cardiovascular negative cardio ROS  Rhythm:Regular Rate:Normal     Neuro/Psych negative neurological ROS  negative psych ROS   GI/Hepatic negative GI ROS, Neg liver ROS,   Endo/Other  negative endocrine ROS  Renal/GU negative Renal ROS  negative genitourinary   Musculoskeletal   Abdominal   Peds  Hematology negative hematology ROS (+) anemia ,   Anesthesia Other Findings   Reproductive/Obstetrics negative OB ROS                            Anesthesia Physical Anesthesia Plan  ASA: III  Anesthesia Plan: General   Post-op Pain Management:    Induction: Intravenous  Airway Management Planned: Oral ETT and Video Laryngoscope Planned  Additional Equipment:   Intra-op Plan:   Post-operative Plan: Extubation in OR  Informed Consent: I have reviewed the patients History and Physical, chart, labs and discussed the procedure including the risks, benefits and alternatives for the proposed anesthesia with the patient or authorized representative who has indicated his/her understanding and acceptance.   Dental advisory given  Plan Discussed with: CRNA  Anesthesia Plan Comments:         Anesthesia Quick Evaluation
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
## 2014-10-31 NOTE — Anesthesia Postprocedure Evaluation (Signed)
  Anesthesia Post-op Note  Patient: Lance Cline  Procedure(s) Performed: Procedure(s): IRRIGATION AND DEBRIDEMENT EXTREMITY (Left)  Patient Location: PACU  Anesthesia Type:General  Level of Consciousness: awake and alert   Airway and Oxygen Therapy: Patient Spontanous Breathing  Post-op Pain: moderate  Post-op Assessment: Post-op Vital signs reviewed, Patient's Cardiovascular Status Stable and Respiratory Function Stable  Post-op Vital Signs: Reviewed  Filed Vitals:   10/31/14 1100  BP: 105/55  Pulse: 103  Temp:   Resp: 17    Complications: No apparent anesthesia complications

## 2014-11-01 LAB — CBC
HEMATOCRIT: 26.9 % — AB (ref 39.0–52.0)
Hemoglobin: 8.8 g/dL — ABNORMAL LOW (ref 13.0–17.0)
MCH: 27.4 pg (ref 26.0–34.0)
MCHC: 32.7 g/dL (ref 30.0–36.0)
MCV: 83.8 fL (ref 78.0–100.0)
PLATELETS: 235 10*3/uL (ref 150–400)
RBC: 3.21 MIL/uL — ABNORMAL LOW (ref 4.22–5.81)
RDW: 13.9 % (ref 11.5–15.5)
WBC: 9.9 10*3/uL (ref 4.0–10.5)

## 2014-11-01 MED ORDER — MENTHOL 3 MG MT LOZG
1.0000 | LOZENGE | OROMUCOSAL | Status: DC | PRN
  Administered 2014-11-02: 3 mg via ORAL

## 2014-11-01 MED ORDER — MENTHOL 3 MG MT LOZG
1.0000 | LOZENGE | OROMUCOSAL | Status: DC | PRN
  Filled 2014-11-01: qty 9

## 2014-11-01 NOTE — Progress Notes (Signed)
UR completed.  RN CM continuing to follow for any d/c arrangements.  If VAC will be needed for home, please notify CM as soon as able.  Carlyle LipaMichelle Chasya Zenz, RN BSN MHA CCM Trauma/Neuro ICU Case Manager [phone]
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
## 2014-11-01 NOTE — Progress Notes (Signed)
Trauma Service Note  Subjective: Patient complaining of sore throat and left leg pain.    Objective: Vital signs in last 24 hours: Temp:  [99 F (37.2 C)-100.3 F (37.9 C)] 100.3 F (37.9 C) (05/13 0526) Pulse Rate:  [74-132] 115 (05/13 0526) Resp:  [16-23] 18 (05/13 0526) BP: (97-125)/(47-74) 115/67 mmHg (05/13 0526) SpO2:  [97 %-100 %] 100 % (05/13 0526) Last BM Date: 10/26/14  Intake/Output from previous day: 05/12 0701 - 05/13 0700 In: 1755 [I.V.:1300; IV Piggyback:455] Out: 2500 [Urine:2300; Drains:150; Blood:50] Intake/Output this shift:    General: No acute distress  Lungs: Clear  Abd: Benign  Extremities: VAC on left leg.  Patient opened his knee immobilizer, but did not bend the knee.  Neuro: Intact  Lab Results: CBC   Recent Labs  10/30/14 0225 11/01/14 0313  WBC 9.2 9.9  HGB 8.8* 8.8*  HCT 25.8* 26.9*  PLT 153 235   BMET No results for input(s): NA, K, CL, CO2, GLUCOSE, BUN, CREATININE, CALCIUM in the last 72 hours. PT/INR No results for input(s): LABPROT, INR in the last 72 hours. ABG No results for input(s): PHART, HCO3 in the last 72 hours.  Invalid input(s): PCO2, PO2  Studies/Results: No results found.  Anti-infectives: Anti-infectives    Start     Dose/Rate Route Frequency Ordered Stop   11/01/14 1600  gentamicin (GARAMYCIN) 620 mg in dextrose 5 % 100 mL IVPB     7 mg/kg  88.8 kg (Adjusted) 115.5 mL/hr over 60 Minutes Intravenous Every 24 hours 10/31/14 1440     10/28/14 0800  gentamicin (GARAMYCIN) 620 mg in dextrose 5 % 100 mL IVPB  Status:  Discontinued     7 mg/kg  88.8 kg (Adjusted) 115.5 mL/hr over 60 Minutes Intravenous Every 36 hours 10/27/14 0902 10/31/14 1440   10/26/14 2200  ceFAZolin (ANCEF) IVPB 2 g/50 mL premix     2 g 100 mL/hr over 30 Minutes Intravenous 3 times per day 10/26/14 1852     10/26/14 2000  gentamicin (GARAMYCIN) 620 mg in dextrose 5 % 100 mL IVPB  Status:  Discontinued     7 mg/kg  88.8 kg  (Adjusted) 115.5 mL/hr over 60 Minutes Intravenous Every 24 hours 10/26/14 1903 10/27/14 0902   10/26/14 1315  ceFAZolin (ANCEF) IVPB 2 g/50 mL premix     2 g 100 mL/hr over 30 Minutes Intravenous  Once 10/26/14 1300 10/26/14 1419   10/26/14 1315  ceFAZolin (ANCEF) IVPB 2 g/50 mL premix  Status:  Discontinued     2 g 100 mL/hr over 30 Minutes Intravenous To Emergency Dept 10/26/14 1303 10/27/14 0901   10/26/14 1315  gentamicin (GARAMYCIN) IVPB 100 mg     100 mg 200 mL/hr over 30 Minutes Intravenous To Emergency Dept 10/26/14 1303 10/26/14 1412   10/26/14 1315  penicillin G potassium 4 Million Units in dextrose 5 % 250 mL IVPB     4 Million Units 250 mL/hr over 60 Minutes Intravenous To Emergency Dept 10/26/14 1303 10/26/14 1434      Assessment/Plan: s/p Procedure(s): IRRIGATION AND DEBRIDEMENT EXTREMITY Continue current management.  Cepacol lozengers for sore throat.    LOS: 6 days   Marta LamasJames O. Gae BonWyatt, III, MD, FACS [phone] Trauma Surgeon 11/01/2014

## 2014-11-01 NOTE — Progress Notes (Signed)
Bilateral femur dressings c/d/i VAC in place To OR with [facility] on Monday for repeat I&D possible ORIF acetabulum Stable from bilateral femur standpoint  N. Glee ArvinMichael Daniella Dewberry, MD [facility] [phone] 9:04 AM
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
## 2014-11-03 LAB — BASIC METABOLIC PANEL
Anion gap: 12 (ref 5–15)
BUN: 12 mg/dL (ref 6–20)
CHLORIDE: 90 mmol/L — AB (ref 101–111)
CO2: 28 mmol/L (ref 22–32)
Calcium: 8.2 mg/dL — ABNORMAL LOW (ref 8.9–10.3)
Creatinine, Ser: 0.98 mg/dL (ref 0.61–1.24)
GLUCOSE: 196 mg/dL — AB (ref 65–99)
Potassium: 4 mmol/L (ref 3.5–5.1)
Sodium: 130 mmol/L — ABNORMAL LOW (ref 135–145)

## 2014-11-03 LAB — TYPE AND SCREEN
ABO/RH(D): AB POS
Antibody Screen: NEGATIVE
Unit division: 0
Unit division: 0

## 2014-11-03 LAB — TISSUE CULTURE

## 2014-11-03 LAB — GENTAMICIN LEVEL, TROUGH: Gentamicin Trough: <0.5 ug/mL — ABNORMAL LOW (ref 0.5–2.0)

## 2014-11-03 MED ORDER — SODIUM CHLORIDE 0.9 % IV SOLN
3.0000 g | Freq: Four times a day (QID) | INTRAVENOUS | Status: DC
  Administered 2014-11-03 – 2014-11-20 (×65): 3 g via INTRAVENOUS
  Filled 2014-11-03 (×78): qty 3

## 2014-11-03 MED ORDER — SODIUM CHLORIDE 0.9 % IV SOLN
3.0000 g | Freq: Four times a day (QID) | INTRAVENOUS | Status: DC
  Filled 2014-11-03 (×4): qty 3

## 2014-11-03 NOTE — Progress Notes (Signed)
3 Days Post-Op  Subjective: Sore throat and hiccups slightly better  Objective: Vital signs in last 24 hours: Temp:  [98.3 F (36.8 C)-99.9 F (37.7 C)] 98.3 F (36.8 C) (05/15 0506) Pulse Rate:  [93-97] 93 (05/15 0506) Resp:  [16-18] 16 (05/15 0506) BP: (92-112)/(50-67) 108/67 mmHg (05/15 0506) SpO2:  [97 %-100 %] 100 % (05/15 0506) Last BM Date: 10/26/14  Intake/Output from previous day: 05/14 0701 - 05/15 0700 In: 825.5 [P.O.:600; IV Piggyback:225.5] Out: 4525 [Urine:4150; Drains:375] Intake/Output this shift:    Lungs clear Abdomen soft, NT/ND  Lab Results:   Recent Labs  11/01/14 0313  WBC 9.9  HGB 8.8*  HCT 26.9*  PLT 235   BMET  Recent Labs  11/03/14 0557  NA 130*  K 4.0  CL 90*  CO2 28  GLUCOSE 196*  BUN 12  CREATININE 0.98  CALCIUM 8.2*   PT/INR No results for input(s): LABPROT, INR in the last 72 hours. ABG No results for input(s): PHART, HCO3 in the last 72 hours.  Invalid input(s): PCO2, PO2  Studies/Results: No results found.  Anti-infectives: Anti-infectives    Start     Dose/Rate Route Frequency Ordered Stop   11/01/14 1600  gentamicin (GARAMYCIN) 620 mg in dextrose 5 % 100 mL IVPB     7 mg/kg  88.8 kg (Adjusted) 115.5 mL/hr over 60 Minutes Intravenous Every 24 hours 10/31/14 1440     10/28/14 0800  gentamicin (GARAMYCIN) 620 mg in dextrose 5 % 100 mL IVPB  Status:  Discontinued     7 mg/kg  88.8 kg (Adjusted) 115.5 mL/hr over 60 Minutes Intravenous Every 36 hours 10/27/14 0902 10/31/14 1440   10/26/14 2200  ceFAZolin (ANCEF) IVPB 2 g/50 mL premix     2 g 100 mL/hr over 30 Minutes Intravenous 3 times per day 10/26/14 1852     10/26/14 2000  gentamicin (GARAMYCIN) 620 mg in dextrose 5 % 100 mL IVPB  Status:  Discontinued     7 mg/kg  88.8 kg (Adjusted) 115.5 mL/hr over 60 Minutes Intravenous Every 24 hours 10/26/14 1903 10/27/14 0902   10/26/14 1315  ceFAZolin (ANCEF) IVPB 2 g/50 mL premix     2 g 100 mL/hr over 30 Minutes  Intravenous  Once 10/26/14 1300 10/26/14 1419   10/26/14 1315  ceFAZolin (ANCEF) IVPB 2 g/50 mL premix  Status:  Discontinued     2 g 100 mL/hr over 30 Minutes Intravenous To Emergency Dept 10/26/14 1303 10/27/14 0901   10/26/14 1315  gentamicin (GARAMYCIN) IVPB 100 mg     100 mg 200 mL/hr over 30 Minutes Intravenous To Emergency Dept 10/26/14 1303 10/26/14 1412   10/26/14 1315  penicillin G potassium 4 Million Units in dextrose 5 % 250 mL IVPB     4 Million Units 250 mL/hr over 60 Minutes Intravenous To Emergency Dept 10/26/14 1303 10/26/14 1434      Assessment/Plan: s/p Procedure(s): IRRIGATION AND DEBRIDEMENT EXTREMITY (Left)  Continuing current care Surgery per ortho  LOS: 8 days    Lance Cline 11/03/2014

## 2014-11-03 NOTE — Progress Notes (Signed)
Patient ID: Dola FactorBobby Wollman, male   DOB: June 02, 1976, 39 y.o.   MRN: [id number] Called by Enrique SackKendra pharmacist.  Tissue cultures positive for Acinebacter.  Succeptable to Unasyn.  Resistant to Ceftriaxone and Intermediate to [place].  D/C'd Ancef.  Started Unasyn and continue Gentamycin.  Ester Mabe A. Gwinda PasseShepperson, PA-C Physician Assistant [facility] [phone]  11/03/2014, 12:41 PM

## 2014-11-03 NOTE — Progress Notes (Addendum)
ANTIBIOTIC CONSULT NOTE - FOLLOW UP  Pharmacy Consult for Gentamicin Indication: Open fracture, degloving injury  No Known Allergies  Patient Measurements: Height: 5\' 11"  (180.3 cm) Weight: 241 lb 6.5 oz (109.5 kg) IBW/kg (Calculated) : 75.3 Adjusted Body Weight:   Vital Signs: Temp: 98.3 F (36.8 C) (05/15 0506) Temp Source: Oral (05/15 0506) BP: 108/67 mmHg (05/15 0506) Pulse Rate: 93 (05/15 0506) Intake/Output from previous day: 05/14 0701 - 05/15 0700 In: 825.5 [P.O.:600; IV Piggyback:225.5] Out: 4525 [Urine:4150; Drains:375] Intake/Output from this shift: Total I/O In: -  Out: 600 [Stool:600]  Labs:  Recent Labs  11/01/14 0313 11/03/14 0557  WBC 9.9  --   HGB 8.8*  --   PLT 235  --   CREATININE  --  0.98   Estimated Creatinine Clearance: 124.9 mL/min (by C-G formula based on Cr of 0.98). No results for input(s): VANCOTROUGH, VANCOPEAK, VANCORANDOM, GENTTROUGH, GENTPEAK, GENTRANDOM, TOBRATROUGH, TOBRAPEAK, TOBRARND, AMIKACINPEAK, AMIKACINTROU, AMIKACIN in the last 72 hours.   Microbiology: Recent Results (from the past 720 hour(s))  MRSA PCR Screening     Status: None   Collection Time: 10/26/14  6:37 PM  Result Value Ref Range Status   MRSA by PCR NEGATIVE NEGATIVE Final    Comment:        The GeneXpert MRSA Assay (FDA approved for NASAL specimens only), is one component of a comprehensive MRSA colonization surveillance program. It is not intended to diagnose MRSA infection nor to guide or monitor treatment for MRSA infections.   Anaerobic culture     Status: None (Preliminary result)   Collection Time: 10/31/14  9:09 AM  Result Value Ref Range Status   Specimen Description TISSUE LEFT THIGH  Final   Special Requests NONE  Final   Gram Stain   Final    RARE WBC PRESENT, PREDOMINANTLY PMN NO ORGANISMS SEEN Performed at [facility] Performed at [facility]    Culture   Final    NO ANAEROBES ISOLATED; CULTURE IN PROGRESS FOR 5  DAYS Note: Gram Stain Report Called to,Read Back By and Verified WithFrancene Finders: J QUEEN RN AT [phone] 10/31/14 BY Lucrezia EuropeK BARR Performed at [facility]    Report Status PENDING  Incomplete  Tissue culture     Status: None   Collection Time: 10/31/14  9:09 AM  Result Value Ref Range Status   Specimen Description TISSUE LEFT THIGH  Final   Special Requests NONE  Final   Gram Stain   Final    RARE WBC PRESENT, PREDOMINANTLY PMN NO ORGANISMS SEEN Performed at [facility] Performed at [facility]    Culture   Final    ABUNDANT ACINETOBACTER CALCOACETICUS/BAUMANNII COMPLEX Performed at [facility]    Report Status 11/03/2014 FINAL  Final   Organism ID, Bacteria ACINETOBACTER CALCOACETICUS/BAUMANNII COMPLEX  Final      Susceptibility   Acinetobacter calcoaceticus/baumannii complex - MIC*    AMPICILLIN/SULBACTAM <=2 SENSITIVE Sensitive     CEFTAZIDIME 16 INTERMEDIATE Intermediate     CEFTRIAXONE >=64 RESISTANT Resistant     CIPROFLOXACIN 1 SENSITIVE Sensitive     GENTAMICIN <=1 SENSITIVE Sensitive     IMIPENEM <=0.25 SENSITIVE Sensitive     PIP/TAZO 16 SENSITIVE Sensitive     TOBRAMYCIN <=1 SENSITIVE Sensitive     TRIMETH/SULFA <=20 SENSITIVE Sensitive     * ABUNDANT ACINETOBACTER CALCOACETICUS/BAUMANNII COMPLEX  Gram stain     Status: None   Collection Time: 10/31/14  9:10 AM  Result Value Ref  Range Status   Specimen Description TISSUE LEFT THIGH  Final   Special Requests NONE  Final   Gram Stain   Final    RARE WBC SEEN NO ORGANISMS SEEN Gram Stain Report Called to,Read Back By and Verified With: Joselyn ArrowJ QUEEN,RN AT 0947 10/31/14 BY K BARR    Report Status 10/31/2014 FINAL  Final    Anti-infectives    Start     Dose/Rate Route Frequency Ordered Stop   11/01/14 1600  gentamicin (GARAMYCIN) 620 mg in dextrose 5 % 100 mL IVPB     7 mg/kg  88.8 kg (Adjusted) 115.5 mL/hr over 60 Minutes Intravenous Every 24 hours 10/31/14 1440     10/28/14 0800  gentamicin  (GARAMYCIN) 620 mg in dextrose 5 % 100 mL IVPB  Status:  Discontinued     7 mg/kg  88.8 kg (Adjusted) 115.5 mL/hr over 60 Minutes Intravenous Every 36 hours 10/27/14 0902 10/31/14 1440   10/26/14 2200  ceFAZolin (ANCEF) IVPB 2 g/50 mL premix     2 g 100 mL/hr over 30 Minutes Intravenous 3 times per day 10/26/14 1852     10/26/14 2000  gentamicin (GARAMYCIN) 620 mg in dextrose 5 % 100 mL IVPB  Status:  Discontinued     7 mg/kg  88.8 kg (Adjusted) 115.5 mL/hr over 60 Minutes Intravenous Every 24 hours 10/26/14 1903 10/27/14 0902   10/26/14 1315  ceFAZolin (ANCEF) IVPB 2 g/50 mL premix     2 g 100 mL/hr over 30 Minutes Intravenous  Once 10/26/14 1300 10/26/14 1419   10/26/14 1315  ceFAZolin (ANCEF) IVPB 2 g/50 mL premix  Status:  Discontinued     2 g 100 mL/hr over 30 Minutes Intravenous To Emergency Dept 10/26/14 1303 10/27/14 0901   10/26/14 1315  gentamicin (GARAMYCIN) IVPB 100 mg     100 mg 200 mL/hr over 30 Minutes Intravenous To Emergency Dept 10/26/14 1303 10/26/14 1412   10/26/14 1315  penicillin G potassium 4 Million Units in dextrose 5 % 250 mL IVPB     4 Million Units 250 mL/hr over 60 Minutes Intravenous To Emergency Dept 10/26/14 1303 10/26/14 1434      Assessment: 39yo male on empiric Cefazolin & Gentamicin (5/7 >> ).  Tm 99.9.  His Cr < 1 today and WBC wnl on 5/13.  UOP 1.786ml/kg/hr.  Tissue cx is (+) abundant Acinetobacter calcoaceticus/baumannii complex which is resistant to ceftriaxone and intermediate sensitivity to ceftazidime.  He is returning to the OR tomorrow for repeat I&D.  Pt is at high risk of soft tissue infection given severity of injury.  Goal of Therapy:  Gentamicin Pk 3-4mg /L and Gentamicin Tr < 1mg /L  Plan:  Continue Gentamicin 620mg  IV q24 Paged on-call Ortho re: culture results- d/c Ceftriaxone and start Unasyn. Continue to monitor renal fxn Gentamicin Tr today  Marisue HumbleKendra Leiloni Smithers, PharmD Clinical Pharmacist [facility]   1710  Addendum:    Gentamicin trough is undetectable on extended interval protocol, as is the goal.  Will continue current dosing and continue to follow renal fxn.   Marisue HumbleKendra Jordayn Mink, PharmD Clinical Pharmacist [place] [facility]
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
## 2014-11-03 NOTE — Clinical Social Work Placement (Signed)
   CLINICAL SOCIAL WORK PLACEMENT  NOTE  Date:  11/03/2014  Patient Details  Name: Lance Cline MRN: [id number] Date of Birth: 06/12/1976  Clinical Social Work is seeking post-discharge placement for this patient at the Skilled  Nursing Facility level of care (*CSW will initial, date and re-position this form in  chart as items are completed):      Patient/family provided with [facility] Clinical Social Work Department's list of facilities offering this level of care within the geographic area requested by the patient (or if unable, by the patient's family).      Patient/family informed of their freedom to choose among providers that offer the needed level of care, that participate in Medicare, Medicaid or managed care program needed by the patient, have an available bed and are willing to accept the patient.      Patient/family informed of [facility]'s ownership interest in [facility] and [facility], as well as of the fact that they are under no obligation to receive care at these facilities.  PASRR submitted to EDS on 11/03/14     PASRR number received on 11/03/14     Existing PASRR number confirmed on       FL2 transmitted to all facilities in geographic area requested by pt/family on       FL2 transmitted to all facilities within larger geographic area on       Patient informed that his/her managed care company has contracts with or will negotiate with certain facilities, including the following:            Patient/family informed of bed offers received.  Patient chooses bed at       Physician recommends and patient chooses bed at      Patient to be transferred to   on  .  Patient to be transferred to facility by       Patient family notified on   of transfer.  Name of family member notified:        PHYSICIAN Please prepare priority discharge summary, including medications, Please sign FL2     Additional Comment:  11/03/14 Patient is fairly resistant to  idea of SNF for rehab at this time- however- family insists he does not have 24/7 supervision. Patient is able to verbalize understanding of this but is still hopeful to be able to return home with GF. He will undergo further surgery - possibly tomorrow and CSW did not discuss SNF further at this time.  Lorri Frederickonna T. Jaci LazierCrowder, [place] [phone] (weekend coverage)      _______________________________________________ Darylene Pricerowder, Estil Vallee T, LCSW 11/03/2014, 3:53 PM

## 2014-11-04 ENCOUNTER — Encounter (HOSPITAL_COMMUNITY): Payer: Self-pay | Admitting: Orthopedic Surgery

## 2014-11-04 ENCOUNTER — Inpatient Hospital Stay (HOSPITAL_COMMUNITY): Payer: MEDICAID | Admitting: Anesthesiology

## 2014-11-04 ENCOUNTER — Inpatient Hospital Stay (HOSPITAL_COMMUNITY): Payer: Self-pay | Admitting: Anesthesiology

## 2014-11-04 ENCOUNTER — Encounter (HOSPITAL_COMMUNITY): Admission: EM | Disposition: A | Discharge status: Rehabilitation Facility | Payer: Self-pay | Source: Home / Self Care

## 2014-11-04 DIAGNOSIS — T814XXA Infection following a procedure, initial encounter: Secondary | ICD-10-CM

## 2014-11-04 DIAGNOSIS — S7291XD Unspecified fracture of right femur, subsequent encounter for closed fracture with routine healing: Secondary | ICD-10-CM

## 2014-11-04 DIAGNOSIS — E46 Unspecified protein-calorie malnutrition: Secondary | ICD-10-CM

## 2014-11-04 DIAGNOSIS — S32810D Multiple fractures of pelvis with stable disruption of pelvic ring, subsequent encounter for fracture with routine healing: Secondary | ICD-10-CM

## 2014-11-04 DIAGNOSIS — S32402D Unspecified fracture of left acetabulum, subsequent encounter for fracture with routine healing: Secondary | ICD-10-CM

## 2014-11-04 DIAGNOSIS — B9689 Other specified bacterial agents as the cause of diseases classified elsewhere: Secondary | ICD-10-CM

## 2014-11-04 DIAGNOSIS — S7292XD Unspecified fracture of left femur, subsequent encounter for closed fracture with routine healing: Secondary | ICD-10-CM

## 2014-11-04 DIAGNOSIS — S71102D Unspecified open wound, left thigh, subsequent encounter: Secondary | ICD-10-CM

## 2014-11-04 HISTORY — PX: I&D EXTREMITY: SHX5045

## 2014-11-04 LAB — CBC
HCT: 27.4 % — ABNORMAL LOW (ref 39.0–52.0)
HEMOGLOBIN: 9 g/dL — AB (ref 13.0–17.0)
MCH: 27.7 pg (ref 26.0–34.0)
MCHC: 32.8 g/dL (ref 30.0–36.0)
MCV: 84.3 fL (ref 78.0–100.0)
Platelets: 492 10*3/uL — ABNORMAL HIGH (ref 150–400)
RBC: 3.25 MIL/uL — ABNORMAL LOW (ref 4.22–5.81)
RDW: 14.3 % (ref 11.5–15.5)
WBC: 14.6 10*3/uL — ABNORMAL HIGH (ref 4.0–10.5)

## 2014-11-04 SURGERY — IRRIGATION AND DEBRIDEMENT EXTREMITY
Anesthesia: General | Site: Leg Upper | Laterality: Left

## 2014-11-04 MED ORDER — VECURONIUM BROMIDE 10 MG IV SOLR
INTRAVENOUS | Status: AC
  Filled 2014-11-04: qty 10

## 2014-11-04 MED ORDER — SODIUM CHLORIDE 0.9 % IV SOLN
3.0000 g | INTRAVENOUS | Status: AC
  Administered 2014-11-04: 3 g via INTRAVENOUS
  Filled 2014-11-04: qty 3

## 2014-11-04 MED ORDER — METHOCARBAMOL 500 MG PO TABS
1000.0000 mg | ORAL_TABLET | Freq: Four times a day (QID) | ORAL | Status: DC | PRN
Start: 2014-11-04 — End: 2014-11-07
  Administered 2014-11-07: 1000 mg via ORAL
  Filled 2014-11-04 (×3): qty 2

## 2014-11-04 MED ORDER — LACTATED RINGERS IV SOLN
INTRAVENOUS | Status: DC
  Administered 2014-11-04 – 2014-11-05 (×3): via INTRAVENOUS

## 2014-11-04 MED ORDER — GLYCOPYRROLATE 0.2 MG/ML IJ SOLN
INTRAMUSCULAR | Status: AC
  Filled 2014-11-04: qty 3

## 2014-11-04 MED ORDER — NEOSTIGMINE METHYLSULFATE 10 MG/10ML IV SOLN
INTRAVENOUS | Status: DC | PRN
Start: 2014-11-04 — End: 2014-11-04
  Administered 2014-11-04: 5 mg via INTRAVENOUS

## 2014-11-04 MED ORDER — VECURONIUM BROMIDE 10 MG IV SOLR
INTRAVENOUS | Status: DC | PRN
  Administered 2014-11-04: 10 mg via INTRAVENOUS
  Administered 2014-11-04: 3 mg via INTRAVENOUS

## 2014-11-04 MED ORDER — OXYCODONE HCL 5 MG PO TABS: 5.0000 mg | ORAL_TABLET | Freq: Once | ORAL | Status: DC | PRN

## 2014-11-04 MED ORDER — ONDANSETRON HCL 4 MG/2ML IJ SOLN
INTRAMUSCULAR | Status: AC
  Filled 2014-11-04: qty 2

## 2014-11-04 MED ORDER — LIDOCAINE HCL (CARDIAC) 20 MG/ML IV SOLN
INTRAVENOUS | Status: DC | PRN
  Administered 2014-11-04: 40 mg via INTRAVENOUS

## 2014-11-04 MED ORDER — LIDOCAINE HCL (CARDIAC) 20 MG/ML IV SOLN
INTRAVENOUS | Status: AC
  Filled 2014-11-04: qty 5

## 2014-11-04 MED ORDER — CHLORPROMAZINE HCL 25 MG/ML IJ SOLN
50.0000 mg | Freq: Three times a day (TID) | INTRAMUSCULAR | Status: DC | PRN
  Filled 2014-11-04: qty 2

## 2014-11-04 MED ORDER — ONDANSETRON HCL 4 MG/2ML IJ SOLN: 4.0000 mg | Freq: Once | INTRAMUSCULAR | Status: DC | PRN

## 2014-11-04 MED ORDER — STERILE WATER FOR INJECTION IJ SOLN
INTRAMUSCULAR | Status: AC
  Filled 2014-11-04: qty 10

## 2014-11-04 MED ORDER — FENTANYL CITRATE (PF) 250 MCG/5ML IJ SOLN
INTRAMUSCULAR | Status: AC
  Filled 2014-11-04: qty 5

## 2014-11-04 MED ORDER — GLYCOPYRROLATE 0.2 MG/ML IJ SOLN
INTRAMUSCULAR | Status: DC | PRN
Start: 2014-11-04 — End: 2014-11-04
  Administered 2014-11-04: 0.6 mg via INTRAVENOUS

## 2014-11-04 MED ORDER — FENTANYL CITRATE (PF) 100 MCG/2ML IJ SOLN
INTRAMUSCULAR | Status: AC
  Filled 2014-11-04: qty 2

## 2014-11-04 MED ORDER — FENTANYL CITRATE (PF) 100 MCG/2ML IJ SOLN
25.0000 ug | INTRAMUSCULAR | Status: DC | PRN
  Administered 2014-11-04: 50 ug via INTRAVENOUS

## 2014-11-04 MED ORDER — OXYCODONE HCL 5 MG PO TABS
10.0000 mg | ORAL_TABLET | ORAL | Status: DC | PRN
  Administered 2014-11-04: 10 mg via ORAL
  Administered 2014-11-04: 15 mg via ORAL
  Administered 2014-11-05: 5 mg via ORAL
  Administered 2014-11-05: 15 mg via ORAL
  Administered 2014-11-05 (×2): 20 mg via ORAL
  Administered 2014-11-05: 15 mg via ORAL
  Administered 2014-11-05 – 2014-11-06 (×5): 20 mg via ORAL
  Administered 2014-11-07 – 2014-11-08 (×3): 10 mg via ORAL
  Administered 2014-11-09 – 2014-11-13 (×18): 20 mg via ORAL
  Administered 2014-11-14: 10 mg via ORAL
  Administered 2014-11-14 – 2014-11-15 (×2): 20 mg via ORAL
  Administered 2014-11-15 – 2014-11-16 (×5): 10 mg via ORAL
  Administered 2014-11-19 – 2014-11-20 (×3): 20 mg via ORAL
  Administered 2014-11-20: 10 mg via ORAL
  Administered 2014-11-20: 20 mg via ORAL
  Administered 2014-11-20: 10 mg via ORAL
  Filled 2014-11-04: qty 3
  Filled 2014-11-04: qty 2
  Filled 2014-11-04 (×3): qty 4
  Filled 2014-11-04 (×2): qty 2
  Filled 2014-11-04 (×2): qty 4
  Filled 2014-11-04: qty 2
  Filled 2014-11-04 (×9): qty 4
  Filled 2014-11-04: qty 2
  Filled 2014-11-04 (×5): qty 4
  Filled 2014-11-04: qty 2
  Filled 2014-11-04: qty 4
  Filled 2014-11-04 (×3): qty 2
  Filled 2014-11-04: qty 4
  Filled 2014-11-04: qty 3
  Filled 2014-11-04: qty 4
  Filled 2014-11-04: qty 2
  Filled 2014-11-04 (×4): qty 4
  Filled 2014-11-04: qty 3
  Filled 2014-11-04 (×5): qty 4
  Filled 2014-11-04: qty 2
  Filled 2014-11-04 (×4): qty 4

## 2014-11-04 MED ORDER — PROPOFOL 10 MG/ML IV BOLUS
INTRAVENOUS | Status: AC
  Filled 2014-11-04: qty 20

## 2014-11-04 MED ORDER — CHLORPROMAZINE HCL 25 MG PO TABS
25.0000 mg | ORAL_TABLET | Freq: Four times a day (QID) | ORAL | Status: AC | PRN
  Administered 2014-11-04: 25 mg via ORAL
  Filled 2014-11-04 (×2): qty 1

## 2014-11-04 MED ORDER — MIDAZOLAM HCL 2 MG/2ML IJ SOLN
INTRAMUSCULAR | Status: AC
  Filled 2014-11-04: qty 2

## 2014-11-04 MED ORDER — SODIUM CHLORIDE 0.9 % IR SOLN
Status: DC | PRN
  Administered 2014-11-04: 1000 mL
  Administered 2014-11-04: 6000 mL

## 2014-11-04 MED ORDER — CHLORPROMAZINE HCL 50 MG PO TABS
50.0000 mg | ORAL_TABLET | Freq: Four times a day (QID) | ORAL | Status: DC
Start: 2014-11-04 — End: 2014-11-04
  Filled 2014-11-04 (×4): qty 1

## 2014-11-04 MED ORDER — MIDAZOLAM HCL 5 MG/5ML IJ SOLN
INTRAMUSCULAR | Status: DC | PRN
  Administered 2014-11-04: 2 mg via INTRAVENOUS

## 2014-11-04 MED ORDER — OXYCODONE HCL 5 MG/5ML PO SOLN: 5.0000 mg | Freq: Once | ORAL | Status: DC | PRN

## 2014-11-04 MED ORDER — FENTANYL CITRATE (PF) 100 MCG/2ML IJ SOLN
INTRAMUSCULAR | Status: DC | PRN
  Administered 2014-11-04: 150 ug via INTRAVENOUS
  Administered 2014-11-04: 50 ug via INTRAVENOUS

## 2014-11-04 MED ORDER — PROPOFOL 10 MG/ML IV BOLUS
INTRAVENOUS | Status: DC | PRN
  Administered 2014-11-04: 180 mg via INTRAVENOUS

## 2014-11-04 SURGICAL SUPPLY — 89 items
APPLIER CLIP 11 MED OPEN (CLIP)
APR CLP MED 11 20 MLT OPN (CLIP)
BANDAGE ELASTIC 4 VELCRO ST LF (GAUZE/BANDAGES/DRESSINGS) ×4 IMPLANT
BLADE SURG 10 STRL SS (BLADE) ×1 IMPLANT
BLADE SURG ROTATE 9660 (MISCELLANEOUS) IMPLANT
BNDG CMPR MED 15X6 ELC VLCR LF (GAUZE/BANDAGES/DRESSINGS) ×3
BNDG COHESIVE 4X5 TAN STRL (GAUZE/BANDAGES/DRESSINGS) ×1 IMPLANT
BNDG ELASTIC 6X15 VLCR STRL LF (GAUZE/BANDAGES/DRESSINGS) ×5 IMPLANT
BNDG GAUZE ELAST 4 BULKY (GAUZE/BANDAGES/DRESSINGS) ×2 IMPLANT
BNDG GAUZE STRTCH 6 (GAUZE/BANDAGES/DRESSINGS) ×3 IMPLANT
BRUSH SCRUB DISP (MISCELLANEOUS) ×10 IMPLANT
CLIP APPLIE 11 MED OPEN (CLIP) IMPLANT
CLOSURE WOUND 1/2 X4 (GAUZE/BANDAGES/DRESSINGS)
COVER SURGICAL LIGHT HANDLE (MISCELLANEOUS) ×6 IMPLANT
DRAIN CHANNEL 10F 3/8 F FF (DRAIN) IMPLANT
DRAIN CHANNEL 15F RND FF W/TCR (WOUND CARE) IMPLANT
DRAPE C-ARM 42X72 X-RAY (DRAPES) ×1 IMPLANT
DRAPE C-ARMOR (DRAPES) ×1 IMPLANT
DRAPE IMP U-DRAPE 54X76 (DRAPES) ×5 IMPLANT
DRAPE INCISE IOBAN 66X45 STRL (DRAPES) ×10 IMPLANT
DRAPE INCISE IOBAN 85X60 (DRAPES) IMPLANT
DRAPE LAPAROTOMY TRNSV 102X78 (DRAPE) ×2 IMPLANT
DRAPE ORTHO SPLIT 77X108 STRL (DRAPES)
DRAPE SURG ORHT 6 SPLT 77X108 (DRAPES) ×2 IMPLANT
DRAPE U-SHAPE 47X51 STRL (DRAPES) ×2 IMPLANT
DRSG ADAPTIC 3X8 NADH LF (GAUZE/BANDAGES/DRESSINGS) ×5 IMPLANT
DRSG MEPILEX BORDER 4X12 (GAUZE/BANDAGES/DRESSINGS) IMPLANT
DRSG MEPILEX BORDER 4X8 (GAUZE/BANDAGES/DRESSINGS) ×4 IMPLANT
DRSG PAD ABDOMINAL 8X10 ST (GAUZE/BANDAGES/DRESSINGS) ×6 IMPLANT
DRSG VAC ATS LRG SENSATRAC (GAUZE/BANDAGES/DRESSINGS) ×4 IMPLANT
DRSG VERSA FOAM LRG 10X15 (GAUZE/BANDAGES/DRESSINGS) ×4 IMPLANT
ELECT BLADE 6.5 EXT (BLADE) IMPLANT
ELECT CAUTERY BLADE 6.4 (BLADE) IMPLANT
ELECT REM PT RETURN 9FT ADLT (ELECTROSURGICAL) ×5
ELECTRODE REM PT RTRN 9FT ADLT (ELECTROSURGICAL) ×3 IMPLANT
EVACUATOR 1/8 PVC DRAIN (DRAIN) IMPLANT
EVACUATOR SILICONE 100CC (DRAIN) IMPLANT
GAUZE SPONGE 4X4 12PLY STRL (GAUZE/BANDAGES/DRESSINGS) IMPLANT
GAUZE SPONGE 4X4 16PLY XRAY LF (GAUZE/BANDAGES/DRESSINGS) IMPLANT
GLOVE BIO SURGEON STRL SZ7.5 (GLOVE) ×10 IMPLANT
GLOVE BIO SURGEON STRL SZ8 (GLOVE) ×5 IMPLANT
GLOVE BIOGEL PI IND STRL 7.5 (GLOVE) ×3 IMPLANT
GLOVE BIOGEL PI IND STRL 8 (GLOVE) ×3 IMPLANT
GLOVE BIOGEL PI INDICATOR 7.5 (GLOVE) ×2
GLOVE BIOGEL PI INDICATOR 8 (GLOVE) ×2
GOWN STRL REUS W/ TWL LRG LVL3 (GOWN DISPOSABLE) ×6 IMPLANT
GOWN STRL REUS W/ TWL XL LVL3 (GOWN DISPOSABLE) ×3 IMPLANT
GOWN STRL REUS W/TWL 2XL LVL3 (GOWN DISPOSABLE) ×5 IMPLANT
GOWN STRL REUS W/TWL LRG LVL3 (GOWN DISPOSABLE) ×10
GOWN STRL REUS W/TWL XL LVL3 (GOWN DISPOSABLE) ×5
HANDPIECE INTERPULSE COAX TIP (DISPOSABLE)
KIT BASIN OR (CUSTOM PROCEDURE TRAY) ×5 IMPLANT
KIT ROOM TURNOVER OR (KITS) ×5 IMPLANT
LIGHT ORTHO (MISCELLANEOUS) ×1 IMPLANT
LOOP VESSEL MAXI BLUE (MISCELLANEOUS) IMPLANT
MANIFOLD NEPTUNE II (INSTRUMENTS) ×5 IMPLANT
NDL MAYO TROCAR (NEEDLE) ×1 IMPLANT
NEEDLE MAYO TROCAR (NEEDLE) IMPLANT
NS IRRIG 1000ML POUR BTL (IV SOLUTION) ×10 IMPLANT
PACK ORTHO EXTREMITY (CUSTOM PROCEDURE TRAY) ×5 IMPLANT
PACK TOTAL JOINT (CUSTOM PROCEDURE TRAY) IMPLANT
PACK UNIVERSAL I (CUSTOM PROCEDURE TRAY) ×5 IMPLANT
PAD ARMBOARD 7.5X6 YLW CONV (MISCELLANEOUS) ×6 IMPLANT
PADDING CAST COTTON 6X4 STRL (CAST SUPPLIES) ×1 IMPLANT
RETRIEVER SUT HEWSON (MISCELLANEOUS) IMPLANT
SET HNDPC FAN SPRY TIP SCT (DISPOSABLE) IMPLANT
SPONGE LAP 18X18 X RAY DECT (DISPOSABLE) IMPLANT
STAPLER VISISTAT 35W (STAPLE) ×5 IMPLANT
STOCKINETTE IMPERVIOUS 9X36 MD (GAUZE/BANDAGES/DRESSINGS) ×1 IMPLANT
STRIP CLOSURE SKIN 1/2X4 (GAUZE/BANDAGES/DRESSINGS) ×1 IMPLANT
SUCTION FRAZIER TIP 10 FR DISP (SUCTIONS) IMPLANT
SUT FIBERWIRE #2 38 T-5 BLUE (SUTURE)
SUT PDS AB 2-0 CT1 27 (SUTURE) IMPLANT
SUT VIC AB 0 CT1 27 (SUTURE)
SUT VIC AB 0 CT1 27XBRD ANBCTR (SUTURE) IMPLANT
SUT VIC AB 1 CT1 18XCR BRD 8 (SUTURE) ×2 IMPLANT
SUT VIC AB 1 CT1 8-18 (SUTURE)
SUT VIC AB 2-0 CT1 27 (SUTURE) ×5
SUT VIC AB 2-0 CT1 TAPERPNT 27 (SUTURE) ×6 IMPLANT
SUTURE FIBERWR #2 38 T-5 BLUE (SUTURE) IMPLANT
TOWEL OR 17X24 6PK STRL BLUE (TOWEL DISPOSABLE) ×5 IMPLANT
TOWEL OR 17X26 10 PK STRL BLUE (TOWEL DISPOSABLE) ×10 IMPLANT
TRAY FOLEY CATH 16FRSI W/METER (SET/KITS/TRAYS/PACK) IMPLANT
TUBE ANAEROBIC SPECIMEN COL (MISCELLANEOUS) IMPLANT
TUBE CONNECTING 12'X1/4 (SUCTIONS)
TUBE CONNECTING 12X1/4 (SUCTIONS) ×1 IMPLANT
UNDERPAD 30X30 INCONTINENT (UNDERPADS AND DIAPERS) ×9 IMPLANT
WATER STERILE IRR 1000ML POUR (IV SOLUTION) ×4 IMPLANT
YANKAUER SUCT BULB TIP NO VENT (SUCTIONS) IMPLANT

## 2014-11-04 NOTE — Anesthesia Postprocedure Evaluation (Signed)
  Anesthesia Post-op Note  Patient: Lance Cline  Procedure(s) Performed: Procedure(s): IRRIGATION AND DEBRIDEMENT LEFT LEG (Left)  Patient Location: PACU  Anesthesia Type:General  Level of Consciousness: awake, alert  and oriented  Airway and Oxygen Therapy: Patient Spontanous Breathing and Patient connected to nasal cannula oxygen  Post-op Pain: mild  Post-op Assessment: Post-op Vital signs reviewed, Patient's Cardiovascular Status Stable, Respiratory Function Stable, Patent Airway and Pain level controlled  Post-op Vital Signs: stable  Last Vitals:  Filed Vitals:   11/04/14 1640  BP:   Pulse: 109  Temp: 36.7 C  Resp: 19    Complications: No apparent anesthesia complications

## 2014-11-04 NOTE — Brief Op Note (Signed)
10/26/2014 - 11/04/2014  5:18 PM  PATIENT:  Lance Cline  39 y.o. male  PRE-OPERATIVE DIAGNOSIS:   1. LEFT ACETABULAR FRACTURE, PELVIC RING FRACTURE 2. Left thigh and hip degloving  POST-OPERATIVE DIAGNOSIS:   1. LEFT ACETABULAR FRACTURE, PELVIC RING FRACTURE 2. Left thigh and hip degloving  PROCEDURE:  Procedure(s): 1. DEBRIDEMENT LEFT LEG (Left) with excision of skin, subcutaneous tissue, muscle fascia 2. APPLICATION OF LARGE WOUND VAC, 35cm x 27.5cm (962.5 cm2) 3. Preparation of site for skin grafting  SURGEON:  Surgeon(s) and Role:    * Myrene GalasMichael Tekila Caillouet, MD - Primary  PHYSICIAN ASSISTANT: [name], PA-C  ANESTHESIA:   general  I/O:  Total I/O In: 1000 [I.V.:1000] Out: 1250 [Urine:1250]  SPECIMEN:  No Specimen  TOURNIQUET:  * No tourniquets in log *   DICTATION: .Other Dictation: Dictation Number [id number]
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
## 2014-11-04 NOTE — Progress Notes (Signed)
UR completed.  Pt likely to need SNF at d/c, at least short term. CSW aware.  Carlyle LipaMichelle Kamorah Nevils, RN BSN MHA CCM Trauma/Neuro ICU Case Manager [phone]

## 2014-11-04 NOTE — Transfer of Care (Signed)
Immediate Anesthesia Transfer of Care Note  Patient: Lance FactorBobby Alderman  Procedure(s) Performed: Procedure(s): IRRIGATION AND DEBRIDEMENT LEFT LEG (Left)  Patient Location: PACU  Anesthesia Type:General  Level of Consciousness: awake, oriented and pateint uncooperative  Airway & Oxygen Therapy: Patient Spontanous Breathing and Patient connected to nasal cannula oxygen  Post-op Assessment: Report given to RN, Post -op Vital signs reviewed and stable and Dr. Noreene LarssonJoslin notified of patient's uncooperative state.  Post vital signs: Reviewed  Last Vitals:  Filed Vitals:   11/04/14 1136  BP: 118/59  Pulse: 90  Temp: 37.3 C  Resp: 16    Complications: No apparent anesthesia complications
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
## 2014-11-04 NOTE — Progress Notes (Signed)
Informed Dr. Wandra Feinstein Murphy patients elevated temp and hiccups - orders received.
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
## 2014-11-04 NOTE — Anesthesia Procedure Notes (Signed)
Procedure Name: Intubation Date/Time: 11/04/2014 1:34 PM Performed by: Lovie CholOCK, Gracianna Vink K Pre-anesthesia Checklist: Patient identified, Emergency Drugs available, Suction available, Patient being monitored and Timeout performed Patient Re-evaluated:Patient Re-evaluated prior to inductionOxygen Delivery Method: Circle system utilized Preoxygenation: Pre-oxygenation with 100% oxygen Intubation Type: IV induction Ventilation: Mask ventilation without difficulty and Oral airway inserted - appropriate to patient size Laryngoscope Size: Miller and 2 Grade View: Grade I Tube type: Oral Tube size: 7.5 mm Number of attempts: 1 Airway Equipment and Method: Stylet Placement Confirmation: ETT inserted through vocal cords under direct vision,  positive ETCO2,  CO2 detector and breath sounds checked- equal and bilateral Secured at: 22 cm Tube secured with: Tape Dental Injury: Teeth and Oropharynx as per pre-operative assessment

## 2014-11-05 ENCOUNTER — Inpatient Hospital Stay (HOSPITAL_COMMUNITY): Payer: Self-pay

## 2014-11-05 LAB — ANAEROBIC CULTURE

## 2014-11-05 LAB — HIV ANTIBODY (ROUTINE TESTING W REFLEX): HIV Screen 4th Generation wRfx: NONREACTIVE

## 2014-11-05 LAB — HEPATITIS C ANTIBODY: HCV AB: NEGATIVE

## 2014-11-05 MED ORDER — CHLORPROMAZINE HCL 25 MG/ML IJ SOLN
50.0000 mg | Freq: Three times a day (TID) | INTRAMUSCULAR | Status: DC | PRN
  Administered 2014-11-05 – 2014-11-06 (×2): 50 mg via INTRAMUSCULAR
  Filled 2014-11-05 (×4): qty 2

## 2014-11-05 MED ORDER — CHLORPROMAZINE HCL 25 MG PO TABS
25.0000 mg | ORAL_TABLET | Freq: Four times a day (QID) | ORAL | Status: DC | PRN
  Filled 2014-11-05: qty 1
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
## 2014-11-05 NOTE — Progress Notes (Signed)
ABD pad and tape were applied to right arm abrasion of patient to prevent the patient's blankets from causing additional irritation and discomfort.

## 2014-11-05 NOTE — Progress Notes (Signed)
Educated patient on why staff and visitors need to gown and glove due to contact precautions.

## 2014-11-06 ENCOUNTER — Encounter (HOSPITAL_COMMUNITY): Payer: Self-pay | Admitting: Orthopedic Surgery

## 2014-11-06 ENCOUNTER — Inpatient Hospital Stay (HOSPITAL_COMMUNITY): Payer: Self-pay

## 2014-11-06 DIAGNOSIS — M7989 Other specified soft tissue disorders: Secondary | ICD-10-CM

## 2014-11-06 MED ORDER — SODIUM CHLORIDE 0.9 % IJ SOLN
10.0000 mL | INTRAMUSCULAR | Status: DC | PRN
  Administered 2014-11-06 – 2014-11-20 (×8): 10 mL
  Filled 2014-11-06 (×8): qty 40

## 2014-11-06 MED ORDER — MORPHINE SULFATE ER 15 MG PO TBCR
15.0000 mg | EXTENDED_RELEASE_TABLET | Freq: Two times a day (BID) | ORAL | Status: DC
  Administered 2014-11-06 – 2014-11-20 (×26): 15 mg via ORAL
  Filled 2014-11-06 (×26): qty 1

## 2014-11-06 NOTE — Progress Notes (Signed)
Bilateral Lower Ext. Duplex Completed. Negative for DVT or SVT in both lower extremities. Marilynne Halstedita Victor Granados, BS, RDMS, RVT

## 2014-11-06 NOTE — Progress Notes (Signed)
Orthopaedic Trauma Service (OTS)  Subjective: Feeling good this am. Pain adequately controlled.  Objective: BP 97/67 mmHg  Pulse 103  Temp(Src) 99.1 F (37.3 C) (Oral)  Resp 19  Ht 5\' 11"  (1.803 m)  Wt 241 lb 6.5 oz (109.5 kg)  BMI 33.68 kg/m2  SpO2 93%  Physical Exam Seal on vac adequate despite proximal dressing not remaining stuck down.  ABD applied and tape but ok for now. 800cc out of vac. Min edema.  Imaging 3D scans have been completed.   Assessment/Plan: OR tomorrow for ORIF of pelvis and acetabulum.  Repeat vac change.  STSG next week. Possibly Monday.  Myrene GalasMichael Sherel Fennell, MD [facility] [phone] (p)

## 2014-11-06 NOTE — Progress Notes (Signed)
Peripherally Inserted Central Catheter/Midline Placement  The IV Nurse has discussed with the patient and/or persons authorized to consent for the patient, the purpose of this procedure and the potential benefits and risks involved with this procedure.  The benefits include less needle sticks, lab draws from the catheter and patient may be discharged home with the catheter.  Risks include, but not limited to, infection, bleeding, blood clot (thrombus formation), and puncture of an artery; nerve damage and irregular heat beat.  Alternatives to this procedure were also discussed.  PICC/Midline Placement Documentation        Lance Cline, Lance Cline 11/06/2014, 11:31 AM
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
## 2014-11-07 ENCOUNTER — Inpatient Hospital Stay (HOSPITAL_COMMUNITY): Payer: Self-pay

## 2014-11-07 ENCOUNTER — Inpatient Hospital Stay: Admit: 2014-11-07 | Payer: Self-pay | Admitting: Orthopedic Surgery

## 2014-11-07 ENCOUNTER — Encounter (HOSPITAL_COMMUNITY): Payer: Self-pay | Admitting: Anesthesiology

## 2014-11-07 ENCOUNTER — Inpatient Hospital Stay (HOSPITAL_COMMUNITY): Payer: Self-pay | Admitting: Anesthesiology

## 2014-11-07 ENCOUNTER — Encounter (HOSPITAL_COMMUNITY): Admission: EM | Disposition: A | Discharge status: Rehabilitation Facility | Payer: Self-pay | Source: Home / Self Care

## 2014-11-07 ENCOUNTER — Inpatient Hospital Stay (HOSPITAL_COMMUNITY): Payer: MEDICAID | Admitting: Anesthesiology

## 2014-11-07 HISTORY — PX: ORIF ACETABULAR FRACTURE: SHX5029

## 2014-11-07 HISTORY — PX: I&D EXTREMITY: SHX5045

## 2014-11-07 LAB — BLOOD PRODUCT ORDER (VERBAL) VERIFICATION

## 2014-11-07 LAB — URINALYSIS, ROUTINE W REFLEX MICROSCOPIC
Bilirubin Urine: NEGATIVE
Glucose, UA: 100 mg/dL — AB
Hgb urine dipstick: NEGATIVE
Ketones, ur: 15 mg/dL — AB
Leukocytes, UA: NEGATIVE
Nitrite: NEGATIVE
Protein, ur: NEGATIVE mg/dL
Specific Gravity, Urine: 1.017 (ref 1.005–1.030)
Urobilinogen, UA: 4 mg/dL — ABNORMAL HIGH (ref 0.0–1.0)
pH: 6 (ref 5.0–8.0)

## 2014-11-07 SURGERY — IRRIGATION AND DEBRIDEMENT EXTREMITY
Anesthesia: General | Site: Leg Upper | Laterality: Left

## 2014-11-07 SURGERY — IRRIGATION AND DEBRIDEMENT EXTREMITY
Anesthesia: General | Laterality: Left

## 2014-11-07 MED ORDER — ROCURONIUM BROMIDE 100 MG/10ML IV SOLN
INTRAVENOUS | Status: DC | PRN
  Administered 2014-11-07: 20 mg via INTRAVENOUS
  Administered 2014-11-07: 10 mg via INTRAVENOUS
  Administered 2014-11-07: 20 mg via INTRAVENOUS
  Administered 2014-11-07: 50 mg via INTRAVENOUS
  Administered 2014-11-07: 20 mg via INTRAVENOUS

## 2014-11-07 MED ORDER — FENTANYL CITRATE (PF) 250 MCG/5ML IJ SOLN
INTRAMUSCULAR | Status: AC
  Filled 2014-11-07: qty 5

## 2014-11-07 MED ORDER — PROPOFOL 10 MG/ML IV BOLUS
INTRAVENOUS | Status: AC
  Filled 2014-11-07: qty 20

## 2014-11-07 MED ORDER — ONDANSETRON HCL 4 MG/2ML IJ SOLN: 4.0000 mg | Freq: Once | INTRAMUSCULAR | Status: DC | PRN

## 2014-11-07 MED ORDER — HYDROMORPHONE HCL 1 MG/ML IJ SOLN
INTRAMUSCULAR | Status: AC
  Filled 2014-11-07: qty 1

## 2014-11-07 MED ORDER — NEOSTIGMINE METHYLSULFATE 10 MG/10ML IV SOLN
INTRAVENOUS | Status: AC
  Filled 2014-11-07: qty 5

## 2014-11-07 MED ORDER — PHENYLEPHRINE 40 MCG/ML (10ML) SYRINGE FOR IV PUSH (FOR BLOOD PRESSURE SUPPORT)
PREFILLED_SYRINGE | INTRAVENOUS | Status: AC
  Filled 2014-11-07: qty 10

## 2014-11-07 MED ORDER — VECURONIUM BROMIDE 10 MG IV SOLR
INTRAVENOUS | Status: DC | PRN
  Administered 2014-11-07 (×4): 2 mg via INTRAVENOUS

## 2014-11-07 MED ORDER — SODIUM CHLORIDE 0.9 % IV SOLN
INTRAVENOUS | Status: DC | PRN
  Administered 2014-11-07 (×2): via INTRAVENOUS

## 2014-11-07 MED ORDER — LACTATED RINGERS IV SOLN
INTRAVENOUS | Status: DC | PRN
  Administered 2014-11-07 (×3): via INTRAVENOUS

## 2014-11-07 MED ORDER — HYDROMORPHONE HCL 1 MG/ML IJ SOLN
INTRAMUSCULAR | Status: AC
  Administered 2014-11-07: 0.5 mg via INTRAVENOUS
  Filled 2014-11-07: qty 1

## 2014-11-07 MED ORDER — OXYCODONE HCL 5 MG PO TABS
ORAL_TABLET | ORAL | Status: AC
  Filled 2014-11-07: qty 2

## 2014-11-07 MED ORDER — METHOCARBAMOL 500 MG PO TABS
1000.0000 mg | ORAL_TABLET | Freq: Four times a day (QID) | ORAL | Status: DC
  Administered 2014-11-08 – 2014-11-20 (×46): 1000 mg via ORAL
  Filled 2014-11-07 (×49): qty 2

## 2014-11-07 MED ORDER — STERILE WATER FOR INJECTION IJ SOLN
INTRAMUSCULAR | Status: AC
  Filled 2014-11-07: qty 10

## 2014-11-07 MED ORDER — ARTIFICIAL TEARS OP OINT
TOPICAL_OINTMENT | OPHTHALMIC | Status: AC
  Filled 2014-11-07: qty 3.5

## 2014-11-07 MED ORDER — NEOSTIGMINE METHYLSULFATE 10 MG/10ML IV SOLN
INTRAVENOUS | Status: DC | PRN
  Administered 2014-11-07: 3 mg via INTRAVENOUS
  Administered 2014-11-07: 2 mg via INTRAVENOUS

## 2014-11-07 MED ORDER — VECURONIUM BROMIDE 10 MG IV SOLR
INTRAVENOUS | Status: AC
  Filled 2014-11-07: qty 10

## 2014-11-07 MED ORDER — ACETAMINOPHEN 10 MG/ML IV SOLN
1000.0000 mg | Freq: Four times a day (QID) | INTRAVENOUS | Status: AC
  Administered 2014-11-08 (×3): 1000 mg via INTRAVENOUS
  Filled 2014-11-07 (×5): qty 100

## 2014-11-07 MED ORDER — ONDANSETRON HCL 4 MG/2ML IJ SOLN
INTRAMUSCULAR | Status: AC
  Filled 2014-11-07: qty 2

## 2014-11-07 MED ORDER — ONDANSETRON HCL 4 MG/2ML IJ SOLN
INTRAMUSCULAR | Status: DC | PRN
  Administered 2014-11-07: 4 mg via INTRAVENOUS

## 2014-11-07 MED ORDER — ROCURONIUM BROMIDE 50 MG/5ML IV SOLN
INTRAVENOUS | Status: AC
  Filled 2014-11-07: qty 1

## 2014-11-07 MED ORDER — HYDROMORPHONE HCL 1 MG/ML IJ SOLN
0.5000 mg | INTRAMUSCULAR | Status: DC | PRN
  Administered 2014-11-07 – 2014-11-20 (×14): 1 mg via INTRAVENOUS
  Filled 2014-11-07 (×14): qty 1

## 2014-11-07 MED ORDER — MEPERIDINE HCL 25 MG/ML IJ SOLN: 6.2500 mg | INTRAMUSCULAR | Status: DC | PRN

## 2014-11-07 MED ORDER — SODIUM CHLORIDE 0.9 % IV SOLN
INTRAVENOUS | Status: DC | PRN
  Administered 2014-11-07 (×2): via INTRAVENOUS

## 2014-11-07 MED ORDER — ACETAMINOPHEN 10 MG/ML IV SOLN
INTRAVENOUS | Status: AC
  Filled 2014-11-07: qty 100

## 2014-11-07 MED ORDER — HYDROMORPHONE HCL 1 MG/ML IJ SOLN
0.2500 mg | INTRAMUSCULAR | Status: DC | PRN
  Administered 2014-11-07 (×6): 0.5 mg via INTRAVENOUS

## 2014-11-07 MED ORDER — 0.9 % SODIUM CHLORIDE (POUR BTL) OPTIME
TOPICAL | Status: DC | PRN
  Administered 2014-11-07: 1000 mL

## 2014-11-07 MED ORDER — GLYCOPYRROLATE 0.2 MG/ML IJ SOLN
INTRAMUSCULAR | Status: DC | PRN
  Administered 2014-11-07 (×2): .4 mg via INTRAVENOUS

## 2014-11-07 MED ORDER — MIDAZOLAM HCL 5 MG/5ML IJ SOLN
INTRAMUSCULAR | Status: DC | PRN
  Administered 2014-11-07: 2 mg via INTRAVENOUS

## 2014-11-07 MED ORDER — PHENYLEPHRINE HCL 10 MG/ML IJ SOLN
INTRAMUSCULAR | Status: DC | PRN
  Administered 2014-11-07: 80 ug via INTRAVENOUS
  Administered 2014-11-07: 120 ug via INTRAVENOUS

## 2014-11-07 MED ORDER — SODIUM CHLORIDE 0.9 % IV SOLN
INTRAVENOUS | Status: DC | PRN
  Administered 2014-11-07: 09:00:00 via INTRAVENOUS

## 2014-11-07 MED ORDER — METOCLOPRAMIDE HCL 5 MG/ML IJ SOLN
10.0000 mg | Freq: Three times a day (TID) | INTRAMUSCULAR | Status: AC
  Administered 2014-11-08 – 2014-11-09 (×5): 10 mg via INTRAVENOUS
  Filled 2014-11-07 (×7): qty 2

## 2014-11-07 MED ORDER — GLYCOPYRROLATE 0.2 MG/ML IJ SOLN
INTRAMUSCULAR | Status: AC
  Filled 2014-11-07: qty 4

## 2014-11-07 MED ORDER — ACETAMINOPHEN 10 MG/ML IV SOLN
INTRAVENOUS | Status: DC | PRN
  Administered 2014-11-07: 1000 mg via INTRAVENOUS

## 2014-11-07 MED ORDER — PROPOFOL 10 MG/ML IV BOLUS
INTRAVENOUS | Status: DC | PRN
  Administered 2014-11-07: 150 mg via INTRAVENOUS

## 2014-11-07 MED ORDER — SUCCINYLCHOLINE CHLORIDE 20 MG/ML IJ SOLN
INTRAMUSCULAR | Status: DC | PRN
  Administered 2014-11-07: 140 mg via INTRAVENOUS

## 2014-11-07 MED ORDER — LIDOCAINE HCL (CARDIAC) 20 MG/ML IV SOLN
INTRAVENOUS | Status: AC
  Filled 2014-11-07: qty 5

## 2014-11-07 MED ORDER — GLYCOPYRROLATE 0.2 MG/ML IJ SOLN
INTRAMUSCULAR | Status: AC
  Filled 2014-11-07: qty 1

## 2014-11-07 MED ORDER — ARTIFICIAL TEARS OP OINT
TOPICAL_OINTMENT | OPHTHALMIC | Status: DC | PRN
  Administered 2014-11-07: 1 via OPHTHALMIC

## 2014-11-07 MED ORDER — LIDOCAINE HCL (CARDIAC) 20 MG/ML IV SOLN
INTRAVENOUS | Status: DC | PRN
  Administered 2014-11-07: 100 mg via INTRAVENOUS

## 2014-11-07 MED ORDER — FENTANYL CITRATE (PF) 100 MCG/2ML IJ SOLN
INTRAMUSCULAR | Status: DC | PRN
  Administered 2014-11-07 (×3): 50 ug via INTRAVENOUS
  Administered 2014-11-07: 100 ug via INTRAVENOUS
  Administered 2014-11-07 (×10): 50 ug via INTRAVENOUS

## 2014-11-07 MED ORDER — MIDAZOLAM HCL 2 MG/2ML IJ SOLN
INTRAMUSCULAR | Status: AC
  Filled 2014-11-07: qty 2

## 2014-11-07 MED ORDER — EPHEDRINE SULFATE 50 MG/ML IJ SOLN
INTRAMUSCULAR | Status: AC
  Filled 2014-11-07: qty 1

## 2014-11-07 MED ORDER — SODIUM CHLORIDE 0.9 % IR SOLN
Status: DC | PRN
  Administered 2014-11-07 (×3): 3000 mL

## 2014-11-07 SURGICAL SUPPLY — 124 items
APPLIER CLIP 11 MED OPEN (CLIP) ×4
APR CLP MED 11 20 MLT OPN (CLIP) ×2
BIT DRILL AO MATTA 2.5MX230M (BIT) IMPLANT
BIT DRILL SCALD PELVS II 2.5MM (BIT) ×2 IMPLANT
BIT DRILL TWST MATTA 3.5MX195M (BIT) ×1 IMPLANT
BLADE SURG 10 STRL SS (BLADE) ×10 IMPLANT
BLADE SURG ROTATE 9660 (MISCELLANEOUS) IMPLANT
BNDG COHESIVE 4X5 TAN STRL (GAUZE/BANDAGES/DRESSINGS) ×2 IMPLANT
BNDG GAUZE ELAST 4 BULKY (GAUZE/BANDAGES/DRESSINGS) ×4 IMPLANT
BNDG GAUZE STRTCH 6 (GAUZE/BANDAGES/DRESSINGS) ×3 IMPLANT
BRUSH SCRUB DISP (MISCELLANEOUS) ×12 IMPLANT
CANISTER WOUND CARE 500ML ATS (WOUND CARE) ×4 IMPLANT
CATH FOLEY 2WAY SLVR  5CC 12FR (CATHETERS) ×2
CATH FOLEY 2WAY SLVR 5CC 12FR (CATHETERS) IMPLANT
CLIP APPLIE 11 MED OPEN (CLIP) ×2 IMPLANT
CLOSURE WOUND 1/2 X4 (GAUZE/BANDAGES/DRESSINGS) ×2
COVER SURGICAL LIGHT HANDLE (MISCELLANEOUS) ×5 IMPLANT
DRAIN CHANNEL 10F 3/8 F FF (DRAIN) IMPLANT
DRAIN CHANNEL 15F RND FF W/TCR (WOUND CARE) IMPLANT
DRAPE C-ARM 42X72 X-RAY (DRAPES) ×2 IMPLANT
DRAPE C-ARMOR (DRAPES) ×4 IMPLANT
DRAPE IMP U-DRAPE 54X76 (DRAPES) ×4 IMPLANT
DRAPE INCISE IOBAN 66X45 STRL (DRAPES) IMPLANT
DRAPE INCISE IOBAN 85X60 (DRAPES) ×2 IMPLANT
DRAPE ORTHO SPLIT 77X108 STRL (DRAPES) ×8
DRAPE SURG ORHT 6 SPLT 77X108 (DRAPES) ×4 IMPLANT
DRAPE U-SHAPE 47X51 STRL (DRAPES) ×4 IMPLANT
DRILL BIT AO MATTA 2.5MX230M (BIT) ×8
DRILL LONG 3.5 (BIT) ×2 IMPLANT
DRILL SCALED PELVIS II 2.5MM (BIT) ×4
DRILL TWIST AO MATTA 3.5MX195M (BIT) ×4
DRSG ADAPTIC 3X8 NADH LF (GAUZE/BANDAGES/DRESSINGS) ×2 IMPLANT
DRSG MEPILEX BORDER 4X12 (GAUZE/BANDAGES/DRESSINGS) IMPLANT
DRSG MEPILEX BORDER 4X8 (GAUZE/BANDAGES/DRESSINGS) IMPLANT
DRSG MEPITEL 4X7.2 (GAUZE/BANDAGES/DRESSINGS) ×2 IMPLANT
DRSG VAC ATS LRG SENSATRAC (GAUZE/BANDAGES/DRESSINGS) ×4 IMPLANT
DRSG VERSA FOAM LRG 10X15 (GAUZE/BANDAGES/DRESSINGS) ×8 IMPLANT
ELECT BLADE 6.5 EXT (BLADE) ×8 IMPLANT
ELECT CAUTERY BLADE 6.4 (BLADE) ×3 IMPLANT
ELECT PENCIL ROCKER SW 15FT (MISCELLANEOUS) ×2 IMPLANT
ELECT REM PT RETURN 9FT ADLT (ELECTROSURGICAL) ×4
ELECTRODE REM PT RTRN 9FT ADLT (ELECTROSURGICAL) ×2 IMPLANT
EVACUATOR 1/8 PVC DRAIN (DRAIN) IMPLANT
EVACUATOR SILICONE 100CC (DRAIN) IMPLANT
GAUZE SPONGE 4X4 12PLY STRL (GAUZE/BANDAGES/DRESSINGS) ×2 IMPLANT
GAUZE SPONGE 4X4 16PLY XRAY LF (GAUZE/BANDAGES/DRESSINGS) ×2 IMPLANT
GLOVE BIO SURGEON STRL SZ7.5 (GLOVE) ×8 IMPLANT
GLOVE BIO SURGEON STRL SZ8 (GLOVE) ×8 IMPLANT
GLOVE BIOGEL PI IND STRL 7.5 (GLOVE) ×2 IMPLANT
GLOVE BIOGEL PI IND STRL 8 (GLOVE) ×2 IMPLANT
GLOVE BIOGEL PI INDICATOR 7.5 (GLOVE) ×4
GLOVE BIOGEL PI INDICATOR 8 (GLOVE) ×4
GOWN STRL REUS W/ TWL LRG LVL3 (GOWN DISPOSABLE) ×4 IMPLANT
GOWN STRL REUS W/ TWL XL LVL3 (GOWN DISPOSABLE) ×2 IMPLANT
GOWN STRL REUS W/TWL 2XL LVL3 (GOWN DISPOSABLE) ×7 IMPLANT
GOWN STRL REUS W/TWL LRG LVL3 (GOWN DISPOSABLE) ×8
GOWN STRL REUS W/TWL XL LVL3 (GOWN DISPOSABLE) ×8
GUIDEPIN 2.8X300 THRD TIP OIC (Screw) ×3 IMPLANT
HANDPIECE INTERPULSE COAX TIP (DISPOSABLE) ×4
KIT BASIN OR (CUSTOM PROCEDURE TRAY) ×4 IMPLANT
KIT ROOM TURNOVER OR (KITS) ×4 IMPLANT
LIGHT ORTHO (MISCELLANEOUS) ×4 IMPLANT
LOOP VESSEL MAXI BLUE (MISCELLANEOUS) IMPLANT
MANIFOLD NEPTUNE II (INSTRUMENTS) ×4 IMPLANT
NDL MAYO TROCAR (NEEDLE) ×1 IMPLANT
NEEDLE MAYO TROCAR (NEEDLE) IMPLANT
NS IRRIG 1000ML POUR BTL (IV SOLUTION) ×4 IMPLANT
PACK ORTHO EXTREMITY (CUSTOM PROCEDURE TRAY) ×2 IMPLANT
PACK TOTAL JOINT (CUSTOM PROCEDURE TRAY) ×4 IMPLANT
PACK UNIVERSAL I (CUSTOM PROCEDURE TRAY) ×2 IMPLANT
PAD ARMBOARD 7.5X6 YLW CONV (MISCELLANEOUS) ×11 IMPLANT
PADDING CAST COTTON 6X4 STRL (CAST SUPPLIES) ×2 IMPLANT
PLATE ACET STRT 70.5M 6H (Plate) ×2 IMPLANT
PLATE SUPRAPECTINEAL L (Plate) ×3 IMPLANT
RETRIEVER SUT HEWSON (MISCELLANEOUS) IMPLANT
SCREW 3.5X46MM (Screw) ×6 IMPLANT
SCREW CORTEX ST MATTA 3.5X14 (Screw) ×2 IMPLANT
SCREW CORTEX ST MATTA 3.5X20 (Screw) ×3 IMPLANT
SCREW CORTEX ST MATTA 3.5X22MM (Screw) ×8 IMPLANT
SCREW CORTEX ST MATTA 3.5X24 (Screw) ×4 IMPLANT
SCREW CORTEX ST MATTA 3.5X26MM (Screw) ×3 IMPLANT
SCREW CORTEX ST MATTA 3.5X28MM (Screw) ×3 IMPLANT
SCREW CORTEX ST MATTA 3.5X30MM (Screw) ×2 IMPLANT
SCREW CORTEX ST MATTA 3.5X32MM (Screw) ×4 IMPLANT
SCREW CORTEX ST MATTA 3.5X34MM (Screw) ×3 IMPLANT
SCREW CORTEX ST MATTA 3.5X36MM (Screw) ×6 IMPLANT
SCREW CORTEX ST MATTA 3.5X38M (Screw) ×2 IMPLANT
SCREW CORTEX ST MATTA 3.5X40MM (Screw) ×6 IMPLANT
SCREW CORTEX ST MATTA 3.5X55MM (Screw) ×4 IMPLANT
SCREW CORTEX ST MATTA 3.5X60MM (Screw) ×2 IMPLANT
SCREW CORTEX ST MATTA 3.5X65MM (Screw) ×3 IMPLANT
SCREW CORTEX ST MATTA 3.5X80MM (Screw) ×4 IMPLANT
SCREW CORTEX ST MATTA 3.5X95MM (Screw) ×3 IMPLANT
SET HNDPC FAN SPRY TIP SCT (DISPOSABLE) ×2 IMPLANT
SPONGE LAP 18X18 X RAY DECT (DISPOSABLE) ×14 IMPLANT
STAPLER VISISTAT 35W (STAPLE) ×4 IMPLANT
STOCKINETTE IMPERVIOUS 9X36 MD (GAUZE/BANDAGES/DRESSINGS) ×2 IMPLANT
STRIP CLOSURE SKIN 1/2X4 (GAUZE/BANDAGES/DRESSINGS) ×5 IMPLANT
SUCTION FRAZIER TIP 10 FR DISP (SUCTIONS) ×2 IMPLANT
SUT BONE WAX W31G (SUTURE) ×2 IMPLANT
SUT FIBERWIRE #2 38 T-5 BLUE (SUTURE)
SUT PDS AB 0 CT 36 (SUTURE) ×2 IMPLANT
SUT PDS AB 2-0 CT1 27 (SUTURE) IMPLANT
SUT VIC AB 0 CT1 27 (SUTURE) ×4
SUT VIC AB 0 CT1 27XBRD ANBCTR (SUTURE) ×2 IMPLANT
SUT VIC AB 1 CT1 18XCR BRD 8 (SUTURE) ×2 IMPLANT
SUT VIC AB 1 CT1 27 (SUTURE) ×8
SUT VIC AB 1 CT1 27XBRD ANBCTR (SUTURE) IMPLANT
SUT VIC AB 1 CT1 8-18 (SUTURE) ×8
SUT VIC AB 2-0 CT1 27 (SUTURE) ×4
SUT VIC AB 2-0 CT1 TAPERPNT 27 (SUTURE) ×2 IMPLANT
SUTURE FIBERWR #2 38 T-5 BLUE (SUTURE) IMPLANT
TOWEL OR 17X24 6PK STRL BLUE (TOWEL DISPOSABLE) ×4 IMPLANT
TOWEL OR 17X26 10 PK STRL BLUE (TOWEL DISPOSABLE) ×8 IMPLANT
TRAY FOLEY CATH 16FRSI W/METER (SET/KITS/TRAYS/PACK) ×2 IMPLANT
TUBE ANAEROBIC SPECIMEN COL (MISCELLANEOUS) IMPLANT
TUBE CONNECTING 12'X1/4 (SUCTIONS)
TUBE CONNECTING 12X1/4 (SUCTIONS) ×2 IMPLANT
TUBE CONNECTING 20'X1/4 (TUBING) ×1
TUBE CONNECTING 20X1/4 (TUBING) ×2 IMPLANT
UNDERPAD 30X30 INCONTINENT (UNDERPADS AND DIAPERS) ×10 IMPLANT
WASHER SCREW MATTA SS 9.0X1.0 (Washer) ×3 IMPLANT
WATER STERILE IRR 1000ML POUR (IV SOLUTION) ×4 IMPLANT
YANKAUER SUCT BULB TIP NO VENT (SUCTIONS) ×6 IMPLANT

## 2014-11-07 NOTE — OR Nursing (Signed)
Phoned volunteer desk to update family that the surgery is going well per Dr. Carola FrostHandy request. Message left with Arline Aspindy at volunteer desk.

## 2014-11-07 NOTE — Transfer of Care (Signed)
Immediate Anesthesia Transfer of Care Note  Patient: Lance Cline  Procedure(s) Performed: Procedure(s): IRRIGATION AND DEBRIDEMENT LEFT LEG (Left) OPEN REDUCTION INTERNAL FIXATION (ORIF) ACETABULAR FRACTURE (Left)  Patient Location: PACU  Anesthesia Type:General  Level of Consciousness: awake, alert  and oriented  Airway & Oxygen Therapy: Patient Spontanous Breathing  Post-op Assessment: Report given to RN and Post -op Vital signs reviewed and stable  Post vital signs: Reviewed and stable  Last Vitals:  Filed Vitals:   11/07/14 0600  BP: 104/60  Pulse: 101  Temp: 37.2 C  Resp: 18    Complications: No apparent anesthesia complications   Pt denies pain. Spo2 100% on room air.

## 2014-11-07 NOTE — Anesthesia Postprocedure Evaluation (Signed)
  Anesthesia Post-op Note  Patient: Lance Cline  Procedure(s) Performed: Procedure(s): IRRIGATION AND DEBRIDEMENT LEFT LEG (Left) OPEN REDUCTION INTERNAL FIXATION (ORIF) ACETABULAR FRACTURE (Left)  Patient Location: PACU  Anesthesia Type:General  Level of Consciousness: awake, alert , oriented and patient cooperative  Airway and Oxygen Therapy: Patient Spontanous Breathing and Patient connected to nasal cannula oxygen  Post-op Pain: mild  Post-op Assessment: Post-op Vital signs reviewed, Patient's Cardiovascular Status Stable, Respiratory Function Stable, Patent Airway, No signs of Nausea or vomiting and Pain level controlled  Post-op Vital Signs: Reviewed and stable  Last Vitals:  Filed Vitals:   11/07/14 1742  BP:   Pulse: 85  Temp:   Resp: 14    Complications: No apparent anesthesia complications

## 2014-11-07 NOTE — OR Nursing (Signed)
[place] delivered to family.

## 2014-11-07 NOTE — Progress Notes (Addendum)
I discussed with the patient the risks and benefits of surgery for his left hip and thigh degloving and left acetabuluar fracture and pelvic fractures, including the possibility of infection, nerve injury, vessel injury, wound breakdown, arthritis, symptomatic hardware, DVT/ PE, loss of motion, and need for further surgery among others.  We also specifically discussed the need to stage surgery to improve conditions for grafting and reduce infection, as well as the possibility of foot drop.  He understood these risks and wished to proceed.  He does have weakness of Left EHL, 4/5, at this time.  Myrene GalasMichael Leandre Wien, MD [facility] [phone] (p)

## 2014-11-07 NOTE — Brief Op Note (Signed)
10/26/2014 - 11/07/2014  4:32 PM  PATIENT:  Lance Cline  39 y.o. male  PRE-OPERATIVE DIAGNOSIS:   1. LEFT THIGH AND HIP DEGLOVING S/P GRADE 3B OPEN FEMUR 2. LEFT ANTERIOR COLUMN AND ANTERIOR WALL ACETABULUM FRACTURE 3. PELVIC RING DISRUPTION, ANTERIOR  PUBIC SYMPHYSIS AND POSTERIOR ILIUM  POST-OPERATIVE DIAGNOSIS:   1. LEFT THIGH AND HIP DEGLOVING S/P GRADE 3B FEMUR 2. LEFT ANTERIOR COLUMN AND ANTERIOR WALL ACETABULUM FRACTURE 3. PELVIC RING DISRUPTION, ANTERIOR  PUBIC SYMPHYSIS AND POSTERIOR ILIUM  PROCEDURE:  Procedure(s): 1. OPEN REDUCTION INTERNAL FIXATION (ORIF) ACETABULAR FRACTURE (Left), ANTERIOR COLUMN AND ANTERIOR WALL 2. ORIF OF ANTERIOR PUBIC SYMPHYSIS AND LEFT RAMUS, AND POSTERIOR RING AT ILIUM 3. DEBRIDEMENT OF SKIN, SUBCUTANEOUS TISSUE, AND FASCIA 4. PREPARATION FOR SKIN GRAFTING 5. APPLICATION OF LARGE WOUND VAC  SURGEON:  Surgeon(s) and Role:    * Myrene GalasMichael Cree Napoli, MD - Primary  PHYSICIAN ASSISTANT: [name], PA-C  ANESTHESIA:   general  I/O:  Total I/O In: 5340 [I.V.:4000; Blood:1340] Out: 1880 [Urine:1380; Blood:500]  SPECIMEN:  No Specimen  TOURNIQUET:  * No tourniquets in log *  DICTATION: .Other Dictation: Dictation Number [id number]

## 2014-11-07 NOTE — Progress Notes (Signed)
[phone] in anesthesia. Stated they would call back with any further questions. Nursing will continue to monitor.

## 2014-11-07 NOTE — Anesthesia Preprocedure Evaluation (Signed)
Anesthesia Evaluation  Patient identified by MRN, date of birth, ID band Patient awake    Reviewed: Allergy & Precautions, NPO status , Patient's Chart, lab work & pertinent test results  Airway Mallampati: II  TM Distance: >3 FB Neck ROM: Full    Dental   Pulmonary Current Smoker,    Pulmonary exam normal       Cardiovascular Normal cardiovascular exam    Neuro/Psych    GI/Hepatic   Endo/Other    Renal/GU      Musculoskeletal   Abdominal   Peds  Hematology   Anesthesia Other Findings   Reproductive/Obstetrics                             Anesthesia Physical Anesthesia Plan  ASA: III  Anesthesia Plan: General   Post-op Pain Management:    Induction: Intravenous  Airway Management Planned: Oral ETT  Additional Equipment:   Intra-op Plan:   Post-operative Plan: Possible Post-op intubation/ventilation  Informed Consent: I have reviewed the patients History and Physical, chart, labs and discussed the procedure including the risks, benefits and alternatives for the proposed anesthesia with the patient or authorized representative who has indicated his/her understanding and acceptance.     Plan Discussed with: CRNA and Surgeon  Anesthesia Plan Comments:         Anesthesia Quick Evaluation  

## 2014-11-07 NOTE — Anesthesia Procedure Notes (Signed)
Procedure Name: Intubation Date/Time: 11/07/2014 8:09 AM Performed by: Fransisca KaufmannMEYER, Himmat Enberg E Pre-anesthesia Checklist: Patient identified, Emergency Drugs available, Suction available, Patient being monitored and Timeout performed Patient Re-evaluated:Patient Re-evaluated prior to inductionOxygen Delivery Method: Circle system utilized Preoxygenation: Pre-oxygenation with 100% oxygen Intubation Type: IV induction and Rapid sequence Laryngoscope Size: Miller and 2 Grade View: Grade I Tube type: Oral Tube size: 7.5 mm Number of attempts: 1 Airway Equipment and Method: Stylet Placement Confirmation: ETT inserted through vocal cords under direct vision,  positive ETCO2 and breath sounds checked- equal and bilateral Secured at: 23 cm Tube secured with: Tape Dental Injury: Teeth and Oropharynx as per pre-operative assessment

## 2014-11-07 NOTE — Progress Notes (Signed)
UR completed.  Most likely plan is for SNF at d/c unless pt can make arrangements with family to assist 24/7.  Carlyle LipaMichelle Mariana Wiederholt, RN BSN [facility] CCM Trauma/Neuro ICU Case Manager [phone]

## 2014-11-08 LAB — COMPREHENSIVE METABOLIC PANEL
ALBUMIN: 1.6 g/dL — AB (ref 3.5–5.0)
ALK PHOS: 97 U/L (ref 38–126)
ALT: 59 U/L (ref 17–63)
AST: 115 U/L — ABNORMAL HIGH (ref 15–41)
Anion gap: 7 (ref 5–15)
BUN: 9 mg/dL (ref 6–20)
CHLORIDE: 96 mmol/L — AB (ref 101–111)
CO2: 26 mmol/L (ref 22–32)
Calcium: 7.5 mg/dL — ABNORMAL LOW (ref 8.9–10.3)
Creatinine, Ser: 0.95 mg/dL (ref 0.61–1.24)
GFR calc non Af Amer: >60 mL/min (ref >60)
GLUCOSE: 188 mg/dL — AB (ref 65–99)
Potassium: 4.3 mmol/L (ref 3.5–5.1)
Sodium: 129 mmol/L — ABNORMAL LOW (ref 135–145)
TOTAL PROTEIN: 5.5 g/dL — AB (ref 6.5–8.1)
Total Bilirubin: 1.3 mg/dL — ABNORMAL HIGH (ref 0.3–1.2)

## 2014-11-08 LAB — CBC
HEMATOCRIT: 29.7 % — AB (ref 39.0–52.0)
Hemoglobin: 9.8 g/dL — ABNORMAL LOW (ref 13.0–17.0)
MCH: 28.3 pg (ref 26.0–34.0)
MCHC: 33 g/dL (ref 30.0–36.0)
MCV: 85.8 fL (ref 78.0–100.0)
PLATELETS: 403 10*3/uL — AB (ref 150–400)
RBC: 3.46 MIL/uL — ABNORMAL LOW (ref 4.22–5.81)
RDW: 14.7 % (ref 11.5–15.5)
WBC: 11.4 10*3/uL — AB (ref 4.0–10.5)

## 2014-11-08 LAB — GLUCOSE, CAPILLARY: Glucose-Capillary: 184 mg/dL — ABNORMAL HIGH (ref 65–99)

## 2014-11-08 MED ORDER — NALOXONE HCL 0.4 MG/ML IJ SOLN: 0.4000 mg | INTRAMUSCULAR | Status: DC | PRN

## 2014-11-08 MED ORDER — ONDANSETRON HCL 4 MG/2ML IJ SOLN: 4.0000 mg | Freq: Four times a day (QID) | INTRAMUSCULAR | Status: DC | PRN

## 2014-11-08 MED ORDER — SODIUM CHLORIDE 0.9 % IJ SOLN: 9.0000 mL | INTRAMUSCULAR | Status: DC | PRN

## 2014-11-08 MED ORDER — HYDROMORPHONE 0.3 MG/ML IV SOLN
INTRAVENOUS | Status: DC
  Administered 2014-11-08: 2.7 mg via INTRAVENOUS
  Administered 2014-11-08: 11:00:00 via INTRAVENOUS
  Administered 2014-11-08: 0.6 mg via INTRAVENOUS
  Administered 2014-11-08: 0.3 mg via INTRAVENOUS
  Filled 2014-11-08: qty 25

## 2014-11-08 MED ORDER — DIPHENHYDRAMINE HCL 12.5 MG/5ML PO ELIX
12.5000 mg | ORAL_SOLUTION | Freq: Four times a day (QID) | ORAL | Status: DC | PRN
  Filled 2014-11-08: qty 5

## 2014-11-08 MED ORDER — DIPHENHYDRAMINE HCL 50 MG/ML IJ SOLN: 12.5000 mg | Freq: Four times a day (QID) | INTRAMUSCULAR | Status: DC | PRN
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
## 2014-11-08 NOTE — Progress Notes (Signed)
Rehab Admissions Coordinator Note:  Patient was screened by Clois DupesBoyette, Teighan Aubert Godwin for appropriateness for an Inpatient Acute Rehab Consult per OT recommendation.  At this time, we are recommending Inpatient Rehab consult.  Clois DupesBoyette, Deitra Craine Godwin 11/08/2014, 1:58 PM  I can be reached at [phone].
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
## 2014-11-08 NOTE — Progress Notes (Signed)
Trauma Service Note  Subjective: Patient complaining of lots of lower abdominal pain.    Objective: Vital signs in last 24 hours: Temp:  [97 F (36.1 C)-100.1 F (37.8 C)] 98.1 F (36.7 C) (05/20 0728) Pulse Rate:  [82-103] 103 (05/20 0030) Resp:  [13-22] 13 (05/20 0450) BP: (115-135)/(59-90) 135/81 mmHg (05/20 0450) SpO2:  [91 %-100 %] 96 % (05/20 0450) Weight:  [101.9 kg (224 lb 10.4 oz)] 101.9 kg (224 lb 10.4 oz) (05/19 2240) Last BM Date: 11/03/14  Intake/Output from previous day: 05/19 0701 - 05/20 0700 In: 5340 [I.V.:4000; Blood:1340] Out: 4905 [Urine:4405; Blood:500] Intake/Output this shift:    General: Moderate distress and discomfort in lower abdomen and pelvic area  Lungs: Clear  Abd: Soft, good bowel sounds.  Non-tender  Extremities: Negative pressure wound dressing on left thigh intact  Neuro: Intact  Lab Results: CBC   Recent Labs  11/08/14 0100  WBC 11.4*  HGB 9.8*  HCT 29.7*  PLT 403*   BMET  Recent Labs  11/08/14 0519  NA 129*  K 4.3  CL 96*  CO2 26  GLUCOSE 188*  BUN 9  CREATININE 0.95  CALCIUM 7.5*   PT/INR No results for input(s): LABPROT, INR in the last 72 hours. ABG No results for input(s): PHART, HCO3 in the last 72 hours.  Invalid input(s): PCO2, PO2  Studies/Results: Dg Pelvis Comp Min 3v  11/07/2014   CLINICAL DATA:  Post op acetabular fracture.  EXAM: JUDET PELVIS - 3+ VIEW  COMPARISON:  Intraoperative radiographs of the pelvis-earlier same day ; CT abdomen pelvis - 10/26/2014  FINDINGS: Post ORIF of the pubic symphysis, left acetabulum and left iliac crest. Alignment appears near anatomic.  The cranial aspect of a intra medullary rod is noted within the imaged left femoral diaphysis with 2 transfixing cancellous screws.  Post intra medullary rod fixation of the proximal right femur and right femoral neck with minimal deformity remaining about the proximal diaphysis of the right femur.  Several skin staples are noted  about the right and left buttocks. Surgical clips overlie the left hemipelvis. No radiopaque foreign body.  IMPRESSION: Post ORIF of the pelvis, left acetabulum, bilateral femurs and the right femoral neck as above. No evidence of hardware failure loosening.   Electronically Signed   By: Simonne ComeJohn  Watts M.D.   On: 11/07/2014 19:59   Dg Pelvis Comp Min 3v  11/07/2014   CLINICAL DATA:  ORIF left acetabulum and left ilium fracture repair  EXAM: DG C-ARM GT 120 MIN; JUDET PELVIS - 3+ VIEW  FLUOROSCOPY TIME:  If the device does not provide the exposure index:  Fluoroscopy Time (in minutes and seconds):  1 minutes 43 seconds  Number of Acquired Images:  9  COMPARISON:  None  FINDINGS: Nine intraoperative fluoroscopic spot images are provided. There is evidence of interval placement of a malleable acetabular sideplate transfixing the acetabular fracture. There is a malleable side plate transfixing the ileal fracture. There is a metallic sideplate transfixing the pubic symphyseal diastases.  IMPRESSION: ORIF of the left acetabulum, left ilium and pubic symphysis.   Electronically Signed   By: Elige KoHetal  Patel   On: 11/07/2014 16:35   Dg C-arm Gt 120 Min  11/07/2014   CLINICAL DATA:  ORIF left acetabulum and left ilium fracture repair  EXAM: DG C-ARM GT 120 MIN; JUDET PELVIS - 3+ VIEW  FLUOROSCOPY TIME:  If the device does not provide the exposure index:  Fluoroscopy Time (in minutes and seconds):  1  minutes 43 seconds  Number of Acquired Images:  9  COMPARISON:  None  FINDINGS: Nine intraoperative fluoroscopic spot images are provided. There is evidence of interval placement of a malleable acetabular sideplate transfixing the acetabular fracture. There is a malleable side plate transfixing the ileal fracture. There is a metallic sideplate transfixing the pubic symphyseal diastases.  IMPRESSION: ORIF of the left acetabulum, left ilium and pubic symphysis.   Electronically Signed   By: Elige KoHetal  Patel   On: 11/07/2014 16:35     Anti-infectives: Anti-infectives    Start     Dose/Rate Route Frequency Ordered Stop   11/04/14 1400  Ampicillin-Sulbactam (UNASYN) 3 g in sodium chloride 0.9 % 100 mL IVPB     3 g 100 mL/hr over 60 Minutes Intravenous To Surgery 11/04/14 1356 11/04/14 1400   11/03/14 2000  Ampicillin-Sulbactam (UNASYN) 3 g in sodium chloride 0.9 % 100 mL IVPB     3 g 100 mL/hr over 60 Minutes Intravenous Every 6 hours 11/03/14 1912     11/03/14 1330  Ampicillin-Sulbactam (UNASYN) 3 g in sodium chloride 0.9 % 100 mL IVPB  Status:  Discontinued     3 g 100 mL/hr over 60 Minutes Intravenous Every 6 hours 11/03/14 1243 11/03/14 1912   11/01/14 1600  gentamicin (GARAMYCIN) 620 mg in dextrose 5 % 100 mL IVPB  Status:  Discontinued     7 mg/kg  88.8 kg (Adjusted) 115.5 mL/hr over 60 Minutes Intravenous Every 24 hours 10/31/14 1440 11/04/14 1723   10/28/14 0800  gentamicin (GARAMYCIN) 620 mg in dextrose 5 % 100 mL IVPB  Status:  Discontinued     7 mg/kg  88.8 kg (Adjusted) 115.5 mL/hr over 60 Minutes Intravenous Every 36 hours 10/27/14 0902 10/31/14 1440   10/26/14 2200  ceFAZolin (ANCEF) IVPB 2 g/50 mL premix  Status:  Discontinued     2 g 100 mL/hr over 30 Minutes Intravenous 3 times per day 10/26/14 1852 11/03/14 1243   10/26/14 2000  gentamicin (GARAMYCIN) 620 mg in dextrose 5 % 100 mL IVPB  Status:  Discontinued     7 mg/kg  88.8 kg (Adjusted) 115.5 mL/hr over 60 Minutes Intravenous Every 24 hours 10/26/14 1903 10/27/14 0902   10/26/14 1315  ceFAZolin (ANCEF) IVPB 2 g/50 mL premix     2 g 100 mL/hr over 30 Minutes Intravenous  Once 10/26/14 1300 10/26/14 1419   10/26/14 1315  ceFAZolin (ANCEF) IVPB 2 g/50 mL premix  Status:  Discontinued     2 g 100 mL/hr over 30 Minutes Intravenous To Emergency Dept 10/26/14 1303 10/27/14 0901   10/26/14 1315  gentamicin (GARAMYCIN) IVPB 100 mg     100 mg 200 mL/hr over 30 Minutes Intravenous To Emergency Dept 10/26/14 1303 10/26/14 1412   10/26/14 1315   penicillin G potassium 4 Million Units in dextrose 5 % 250 mL IVPB     4 Million Units 250 mL/hr over 60 Minutes Intravenous To Emergency Dept 10/26/14 1303 10/26/14 1434      Assessment/Plan: s/p Procedure(s): IRRIGATION AND DEBRIDEMENT LEFT LEG OPEN REDUCTION INTERNAL FIXATION (ORIF) ACETABULAR FRACTURE Advance diet Transfer to 5N  LOS: 13 days   Marta LamasJames O. Gae BonWyatt, III, MD, FACS [phone] Trauma Surgeon 11/08/2014

## 2014-11-08 NOTE — Progress Notes (Signed)
PT Cancellation Note  Patient Details Name: Lance Cline MRN: [id number] DOB: January 19, 1976   Cancelled Treatment:    Reason Eval/Treat Not Completed: Other (comment) (Spoke with nsg, reached out to MD office, awaiting clarification WU:[id number]:WBing, MD in OR at this time, will follow up)   Fabio AsaWerner, Elim Economou J 11/08/2014, 9:24 AM Charlotte Crumbevon Kalynne Womac, PT DPT  [phone]
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
## 2014-11-09 LAB — CBC
HEMATOCRIT: 28.3 % — AB (ref 39.0–52.0)
Hemoglobin: 9.4 g/dL — ABNORMAL LOW (ref 13.0–17.0)
MCH: 29 pg (ref 26.0–34.0)
MCHC: 33.2 g/dL (ref 30.0–36.0)
MCV: 87.3 fL (ref 78.0–100.0)
Platelets: 377 10*3/uL (ref 150–400)
RBC: 3.24 MIL/uL — ABNORMAL LOW (ref 4.22–5.81)
RDW: 15.2 % (ref 11.5–15.5)
WBC: 11.5 10*3/uL — ABNORMAL HIGH (ref 4.0–10.5)

## 2014-11-09 MED ORDER — POLYETHYLENE GLYCOL 3350 17 G PO PACK
17.0000 g | PACK | Freq: Two times a day (BID) | ORAL | Status: DC
  Administered 2014-11-09 – 2014-11-18 (×7): 17 g via ORAL
  Filled 2014-11-09 (×17): qty 1
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
## 2014-11-09 NOTE — Progress Notes (Signed)
This RN entered pt room at start of shift to assess pt. RN found that pt was receiving PCA dilaudid without an Eto2 monitor or a continuous pulse ox attached to patients IV pump. RN began to hook patient up to the machines and patient refused to use them. RN educated the patient on the importance and necessity of these machines and if they were not utilized, then the PCA dilaudid would have to be discontinued by a MD. Pt stated he understood. MD on call notified, Dr. Dion SaucierLandau gave verbal orders to discontinue PCA dilaudid. Pt received prn oxycodone instead. Pt resting comfortably. Nursing will continue to monitor.
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
## 2014-11-10 NOTE — Progress Notes (Signed)
S/W Dr.LAndau regarding wound vac leak. Stated to him that we have seal all edges ,changed tubing and checked machine. Attempted to page wound care RN with no return call. Per Dr.Landau pt to have surgery to change vac tomorrow. Ok to "Do the best you can" and leave vac as is. Barbera Settersurner, Virat Prather B RN

## 2014-11-10 NOTE — Progress Notes (Signed)
This RN assessed the wound vac after shift change and discovered a pin hole leak on top of black sponge. Additional Tegaderm applied to the  leaking site. It immediately created suction and obtained 150 cc of sanguineous drainage. Wound vac is working properly at 125 mmHg.  Will continuously monitor.
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
## 2014-11-11 ENCOUNTER — Inpatient Hospital Stay (HOSPITAL_COMMUNITY): Payer: MEDICAID | Admitting: Anesthesiology

## 2014-11-11 ENCOUNTER — Encounter (HOSPITAL_COMMUNITY): Admission: EM | Disposition: A | Discharge status: Rehabilitation Facility | Payer: Self-pay | Source: Home / Self Care

## 2014-11-11 ENCOUNTER — Inpatient Hospital Stay (HOSPITAL_COMMUNITY): Payer: Self-pay | Admitting: Anesthesiology

## 2014-11-11 HISTORY — PX: APPLICATION OF WOUND VAC: SHX5189

## 2014-11-11 HISTORY — PX: I&D EXTREMITY: SHX5045

## 2014-11-11 LAB — POCT I-STAT 4, (NA,K, GLUC, HGB,HCT)
GLUCOSE: 187 mg/dL — AB (ref 65–99)
GLUCOSE: 211 mg/dL — AB (ref 65–99)
Glucose, Bld: 221 mg/dL — ABNORMAL HIGH (ref 65–99)
Glucose, Bld: 209 mg/dL — ABNORMAL HIGH (ref 65–99)
HCT: 28 % — ABNORMAL LOW (ref 39.0–52.0)
HCT: 28 % — ABNORMAL LOW (ref 39.0–52.0)
HEMATOCRIT: 21 % — AB (ref 39.0–52.0)
HEMATOCRIT: 33 % — AB (ref 39.0–52.0)
Hemoglobin: 7.1 g/dL — ABNORMAL LOW (ref 13.0–17.0)
Hemoglobin: 9.5 g/dL — ABNORMAL LOW (ref 13.0–17.0)
Hemoglobin: 11.2 g/dL — ABNORMAL LOW (ref 13.0–17.0)
Hemoglobin: 9.5 g/dL — ABNORMAL LOW (ref 13.0–17.0)
POTASSIUM: 4.6 mmol/L (ref 3.5–5.1)
POTASSIUM: 5.3 mmol/L — AB (ref 3.5–5.1)
Potassium: 5 mmol/L (ref 3.5–5.1)
Potassium: 5.3 mmol/L — ABNORMAL HIGH (ref 3.5–5.1)
Sodium: 129 mmol/L — ABNORMAL LOW (ref 135–145)
Sodium: 129 mmol/L — ABNORMAL LOW (ref 135–145)
Sodium: 131 mmol/L — ABNORMAL LOW (ref 135–145)
Sodium: 131 mmol/L — ABNORMAL LOW (ref 135–145)

## 2014-11-11 SURGERY — IRRIGATION AND DEBRIDEMENT EXTREMITY
Anesthesia: General | Site: Thigh | Laterality: Left

## 2014-11-11 MED ORDER — SODIUM CHLORIDE 0.9 % IV SOLN
3.0000 g | INTRAVENOUS | Status: AC
  Filled 2014-11-11: qty 3

## 2014-11-11 MED ORDER — DEXAMETHASONE SODIUM PHOSPHATE 4 MG/ML IJ SOLN
INTRAMUSCULAR | Status: DC | PRN
  Administered 2014-11-11: 4 mg via INTRAVENOUS

## 2014-11-11 MED ORDER — HYDROMORPHONE HCL 1 MG/ML IJ SOLN
INTRAMUSCULAR | Status: AC
  Filled 2014-11-11: qty 1

## 2014-11-11 MED ORDER — LACTATED RINGERS IV SOLN
INTRAVENOUS | Status: DC | PRN
  Administered 2014-11-11: 13:00:00 via INTRAVENOUS

## 2014-11-11 MED ORDER — PROPOFOL 10 MG/ML IV BOLUS
INTRAVENOUS | Status: DC | PRN
  Administered 2014-11-11: 200 mg via INTRAVENOUS

## 2014-11-11 MED ORDER — LACTATED RINGERS IV SOLN
INTRAVENOUS | Status: DC
  Administered 2014-11-14 (×2): via INTRAVENOUS
  Administered 2014-11-14: 10 mL/h via INTRAVENOUS

## 2014-11-11 MED ORDER — 0.9 % SODIUM CHLORIDE (POUR BTL) OPTIME
TOPICAL | Status: DC | PRN
  Administered 2014-11-11: 1000 mL

## 2014-11-11 MED ORDER — FENTANYL CITRATE (PF) 100 MCG/2ML IJ SOLN
INTRAMUSCULAR | Status: DC | PRN
  Administered 2014-11-11 (×5): 50 ug via INTRAVENOUS

## 2014-11-11 MED ORDER — LIDOCAINE-PRILOCAINE 2.5-2.5 % EX CREA
1.0000 "application " | TOPICAL_CREAM | Freq: Once | CUTANEOUS | Status: DC
  Filled 2014-11-11: qty 5

## 2014-11-11 MED ORDER — MIDAZOLAM HCL 5 MG/5ML IJ SOLN
INTRAMUSCULAR | Status: DC | PRN
  Administered 2014-11-11: 2 mg via INTRAVENOUS

## 2014-11-11 MED ORDER — MINERAL OIL LIGHT 100 % EX OIL
TOPICAL_OIL | CUTANEOUS | Status: AC
  Filled 2014-11-11: qty 25

## 2014-11-11 MED ORDER — ONDANSETRON HCL 4 MG/2ML IJ SOLN
INTRAMUSCULAR | Status: DC | PRN
  Administered 2014-11-11: 4 mg via INTRAVENOUS

## 2014-11-11 MED ORDER — SODIUM CHLORIDE 0.9 % IR SOLN
Status: DC | PRN
  Administered 2014-11-11: 3000 mL

## 2014-11-11 MED ORDER — OXYMETAZOLINE HCL 0.05 % NA SOLN
2.0000 | Freq: Once | NASAL | Status: DC
  Filled 2014-11-11: qty 15

## 2014-11-11 MED ORDER — SODIUM CHLORIDE 0.9 % IV SOLN
INTRAVENOUS | Status: DC | PRN
  Administered 2014-11-11: 13:00:00 via INTRAVENOUS

## 2014-11-11 MED ORDER — ONDANSETRON HCL 4 MG/2ML IJ SOLN: 4.0000 mg | Freq: Once | INTRAMUSCULAR | Status: DC | PRN

## 2014-11-11 MED ORDER — DEXAMETHASONE SODIUM PHOSPHATE 4 MG/ML IJ SOLN
INTRAMUSCULAR | Status: AC
Start: 2014-11-11 — End: 2014-11-11
  Filled 2014-11-11: qty 1

## 2014-11-11 MED ORDER — BUPIVACAINE-EPINEPHRINE (PF) 0.5% -1:200000 IJ SOLN
INTRAMUSCULAR | Status: AC
  Filled 2014-11-11: qty 30

## 2014-11-11 MED ORDER — LIDOCAINE HCL (CARDIAC) 20 MG/ML IV SOLN
INTRAVENOUS | Status: DC | PRN
  Administered 2014-11-11: 100 mg via INTRAVENOUS

## 2014-11-11 MED ORDER — HYDROMORPHONE HCL 1 MG/ML IJ SOLN
0.5000 mg | INTRAMUSCULAR | Status: AC | PRN
  Administered 2014-11-11 (×4): 0.5 mg via INTRAVENOUS

## 2014-11-11 SURGICAL SUPPLY — 73 items
BALL CTTN LRG ABS STRL LF (GAUZE/BANDAGES/DRESSINGS)
BANDAGE ELASTIC 4 VELCRO ST LF (GAUZE/BANDAGES/DRESSINGS) ×3 IMPLANT
BANDAGE ELASTIC 6 VELCRO ST LF (GAUZE/BANDAGES/DRESSINGS) ×5 IMPLANT
BLADE DERMATOME II (BLADE) IMPLANT
BLADE SURG ROTATE 9660 (MISCELLANEOUS) ×3 IMPLANT
BNDG COHESIVE 4X5 TAN STRL (GAUZE/BANDAGES/DRESSINGS) ×3 IMPLANT
BNDG GAUZE ELAST 4 BULKY (GAUZE/BANDAGES/DRESSINGS) IMPLANT
BRUSH SCRUB DISP (MISCELLANEOUS) ×8 IMPLANT
CANISTER SUCTION 2500CC (MISCELLANEOUS) IMPLANT
CANISTER WOUND CARE 500ML ATS (WOUND CARE) ×3 IMPLANT
COTTONBALL LRG STERILE PKG (GAUZE/BANDAGES/DRESSINGS) ×2 IMPLANT
COVER BACK TABLE 60X90IN (DRAPES) ×3 IMPLANT
COVER MAYO STAND STRL (DRAPES) ×3 IMPLANT
COVER SURGICAL LIGHT HANDLE (MISCELLANEOUS) ×5 IMPLANT
DERMACARRIERS GRAFT 1 TO 1.5 (DISPOSABLE)
DRAPE C-ARMOR (DRAPES) ×1 IMPLANT
DRAPE EXTREMITY T 121X128X90 (DRAPE) IMPLANT
DRAPE ORTHO SPLIT 77X108 STRL (DRAPES) ×8
DRAPE PROXIMA HALF (DRAPES) ×8 IMPLANT
DRAPE SURG ORHT 6 SPLT 77X108 (DRAPES) ×2 IMPLANT
DRSG ADAPTIC 3X8 NADH LF (GAUZE/BANDAGES/DRESSINGS) IMPLANT
DRSG PAD ABDOMINAL 8X10 ST (GAUZE/BANDAGES/DRESSINGS) ×2 IMPLANT
DRSG TEGADERM 4X4.75 (GAUZE/BANDAGES/DRESSINGS) ×4 IMPLANT
DRSG VAC ATS LRG SENSATRAC (GAUZE/BANDAGES/DRESSINGS) ×3 IMPLANT
DRSG VERSA FOAM LRG 10X15 (GAUZE/BANDAGES/DRESSINGS) ×3 IMPLANT
ELECT REM PT RETURN 9FT ADLT (ELECTROSURGICAL)
ELECTRODE REM PT RTRN 9FT ADLT (ELECTROSURGICAL) IMPLANT
GAUZE SPONGE 4X4 12PLY STRL (GAUZE/BANDAGES/DRESSINGS) ×2 IMPLANT
GAUZE SPONGE 4X4 16PLY XRAY LF (GAUZE/BANDAGES/DRESSINGS) ×4 IMPLANT
GAUZE XEROFORM 5X9 LF (GAUZE/BANDAGES/DRESSINGS) IMPLANT
GLOVE BIO SURGEON STRL SZ7.5 (GLOVE) ×4 IMPLANT
GLOVE BIO SURGEON STRL SZ8 (GLOVE) ×4 IMPLANT
GLOVE BIOGEL PI IND STRL 6.5 (GLOVE) ×1 IMPLANT
GLOVE BIOGEL PI IND STRL 7.5 (GLOVE) ×2 IMPLANT
GLOVE BIOGEL PI IND STRL 8 (GLOVE) ×2 IMPLANT
GLOVE BIOGEL PI INDICATOR 6.5 (GLOVE) ×2
GLOVE BIOGEL PI INDICATOR 7.5 (GLOVE) ×2
GLOVE BIOGEL PI INDICATOR 8 (GLOVE) ×2
GLOVE SURG SS PI 7.0 STRL IVOR (GLOVE) ×6 IMPLANT
GOWN STRL REUS W/ TWL LRG LVL3 (GOWN DISPOSABLE) ×6 IMPLANT
GOWN STRL REUS W/ TWL XL LVL3 (GOWN DISPOSABLE) ×1 IMPLANT
GOWN STRL REUS W/TWL 2XL LVL3 (GOWN DISPOSABLE) IMPLANT
GOWN STRL REUS W/TWL LRG LVL3 (GOWN DISPOSABLE) ×16
GOWN STRL REUS W/TWL XL LVL3 (GOWN DISPOSABLE)
GRAFT DERMACARRIERS 1 TO 1.5 (DISPOSABLE) IMPLANT
HANDPIECE INTERPULSE COAX TIP (DISPOSABLE)
KIT BASIN OR (CUSTOM PROCEDURE TRAY) ×4 IMPLANT
KIT ROOM TURNOVER OR (KITS) ×4 IMPLANT
MANIFOLD NEPTUNE II (INSTRUMENTS) ×3 IMPLANT
NS IRRIG 1000ML POUR BTL (IV SOLUTION) ×4 IMPLANT
PACK GENERAL/GYN (CUSTOM PROCEDURE TRAY) ×4 IMPLANT
PAD ARMBOARD 7.5X6 YLW CONV (MISCELLANEOUS) ×8 IMPLANT
PAD CAST 4YDX4 CTTN HI CHSV (CAST SUPPLIES) ×1 IMPLANT
PADDING CAST COTTON 4X4 STRL (CAST SUPPLIES)
PADDING CAST COTTON 6X4 STRL (CAST SUPPLIES) IMPLANT
PENCIL BUTTON HOLSTER BLD 10FT (ELECTRODE) ×4 IMPLANT
SET CYSTO W/LG BORE CLAMP LF (SET/KITS/TRAYS/PACK) ×3 IMPLANT
SET HNDPC FAN SPRY TIP SCT (DISPOSABLE) IMPLANT
SPONGE LAP 18X18 X RAY DECT (DISPOSABLE) ×12 IMPLANT
SPONGE SCRUB IODOPHOR (GAUZE/BANDAGES/DRESSINGS) ×4 IMPLANT
STAPLER VISISTAT (STAPLE) ×4 IMPLANT
STAPLER VISISTAT 35W (STAPLE) ×4 IMPLANT
STOCKINETTE IMPERVIOUS LG (DRAPES) ×3 IMPLANT
SUT ETHILON 3 0 FSL (SUTURE) ×4 IMPLANT
SUT PDS AB 0 CT 36 (SUTURE) ×6 IMPLANT
SUT PDS AB 2-0 CT1 27 (SUTURE) ×8 IMPLANT
TOWEL OR 17X24 6PK STRL BLUE (TOWEL DISPOSABLE) ×4 IMPLANT
TOWEL OR 17X26 10 PK STRL BLUE (TOWEL DISPOSABLE) ×8 IMPLANT
TUBE CONNECTING 12'X1/4 (SUCTIONS)
TUBE CONNECTING 12X1/4 (SUCTIONS) IMPLANT
UNDERPAD 30X30 INCONTINENT (UNDERPADS AND DIAPERS) ×4 IMPLANT
WATER STERILE IRR 1000ML POUR (IV SOLUTION) ×4 IMPLANT
YANKAUER SUCT BULB TIP NO VENT (SUCTIONS) IMPLANT

## 2014-11-11 NOTE — Progress Notes (Signed)
I met with pt at bedside and then contacted his parents by phone with his permission. They all prefer him to receive his rehab at [facility] then to go home with his parents who live in a handicapped accessible home. They can arrange 24/7 care at home also after a 1 to 2 weeks rehab stay. Mom is no longer in a wheelchair. Pt does not have medical insurance through his work with [employer] in [place]. I will follow up tomorrow to plan timing of inpt rehab admission. [phone]

## 2014-11-11 NOTE — Progress Notes (Signed)
Pt leaving for OR. I will return tomorrow to discuss with pt and his Father plans for rehab venue. [phone]
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
## 2014-11-11 NOTE — Anesthesia Preprocedure Evaluation (Addendum)
Anesthesia Evaluation  Patient identified by MRN, date of birth, ID band Patient awake    Reviewed: Allergy & Precautions, NPO status , Patient's Chart, lab work & pertinent test results  Airway Mallampati: I       Dental   Pulmonary Current Smoker,    Pulmonary exam normal       Cardiovascular Rhythm:Regular Rate:Normal     Neuro/Psych    GI/Hepatic   Endo/Other    Renal/GU      Musculoskeletal   Abdominal   Peds  Hematology  (+) anemia ,   Anesthesia Other Findings   Reproductive/Obstetrics                            Anesthesia Physical Anesthesia Plan  ASA: II  Anesthesia Plan: General   Post-op Pain Management:    Induction: Intravenous  Airway Management Planned: LMA and Oral ETT  Additional Equipment:   Intra-op Plan:   Post-operative Plan: Extubation in OR  Informed Consent: I have reviewed the patients History and Physical, chart, labs and discussed the procedure including the risks, benefits and alternatives for the proposed anesthesia with the patient or authorized representative who has indicated his/her understanding and acceptance.     Plan Discussed with: CRNA, Anesthesiologist and Surgeon  Anesthesia Plan Comments:         Anesthesia Quick Evaluation
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
## 2014-11-11 NOTE — Progress Notes (Signed)
Orthopaedic Trauma Service Progress Note  Subjective  Doing ok Lots of questions about surgery  Pain controlled   Sounds like pt may be having some nocturnal incontinence  Pt has condom cath on again. States that he pees on himself at night   Review of Systems  Constitutional: Negative for fever and chills.  Respiratory: Negative for shortness of breath and wheezing.   Cardiovascular: Negative for chest pain and palpitations.  Gastrointestinal: Negative for nausea, vomiting and abdominal pain.     Objective   BP 118/66 mmHg  Pulse 93  Temp(Src) 99.4 F (37.4 C) (Oral)  Resp 18  Ht 5\' 11"  (1.803 m)  Wt 101.9 kg (224 lb 10.4 oz)  BMI 31.35 kg/m2  SpO2 98%  Intake/Output      05/22 0701 - 05/23 0700 05/23 0701 - 05/24 0700   P.O. 480 0   I.V. (mL/kg)     IV Piggyback 2500    Total Intake(mL/kg) 2980 (29.2) 0 (0)   Urine (mL/kg/hr) 6100 (2.5)    Drains 350 (0.1)    Stool 0 (0)    Total Output 6450     Net -3470 0        Urine Occurrence 850 x    Stool Occurrence 4 x      Labs  No new labs   Exam  Gen: awake, alert, NAD, lying in bed  Pelvis: stoppa and SW incisions look great, no signs of infection. Soft tissue along L hip looks ok from what I can see  Ext:       Left Lower extremity   Vac in place   Motor and sensory functions grossly intact  Ext warm  + DP pulse  Swelling controlled   Assessment and Plan   POD/HD#: 274   39 y/o [facility] multiple ortho injuries   1. B femur fractures s/p IMN     L anterior column, anterior wall acetabulum fracture s/p ORIF       CM pelvic ring fracture, L side     Complex degloving injury L thigh               Return to OR today for possible STSG L thigh               PT/OT evals             NWB B lex x 8-12 weeks given constellation of fractures               Unrestricted ROM of all LEx joints                 2. Pain management:             per TS   3. ABL anemia/Hemodynamics  Stable for now               4. Medical issues               Per TS  5. DVT/PE prophylaxis:             Lovenox             Convert to coumadin once surgeries are complete   6. ID:                Unasyn x 28 days total               Unasyn day 9 of 28  Long term levaquin after unasyn course               Appreciate ID assistance                 7. Dispo:             Return to OR today              still remains a Limb threatening injury      Mearl Latin, PA-C [facility] [phone] (O) 11/11/2014 9:05 AM

## 2014-11-11 NOTE — Transfer of Care (Signed)
Immediate Anesthesia Transfer of Care Note  Patient: Lance FactorBobby Cogswell  Procedure(s) Performed: Procedure(s): IRRIGATION AND DEBRIDEMENT LEFT THIGH (Left) LEFT THIGH APPLICATION OF WOUND VAC  (Left)  Patient Location: PACU  Anesthesia Type:General  Level of Consciousness: awake, alert , oriented, patient cooperative and responds to stimulation  Airway & Oxygen Therapy: Patient Spontanous Breathing and Patient connected to nasal cannula oxygen  Post-op Assessment: Report given to RN, Post -op Vital signs reviewed and stable and Patient moving all extremities X 4  Post vital signs: stable  Last Vitals:  Filed Vitals:   11/11/14 1236  BP: 120/64  Pulse: 90  Temp: 37.5 C  Resp: 22    Complications: No apparent anesthesia complications
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
## 2014-11-11 NOTE — Anesthesia Procedure Notes (Signed)
Procedure Name: LMA Insertion Date/Time: 11/11/2014 1:29 PM Performed by: Carmela RimaMARTINELLI, Karsyn Rochin F Pre-anesthesia Checklist: Patient being monitored, Suction available, Emergency Drugs available, Patient identified and Timeout performed Patient Re-evaluated:Patient Re-evaluated prior to inductionOxygen Delivery Method: Circle system utilized Preoxygenation: Pre-oxygenation with 100% oxygen Intubation Type: IV induction Ventilation: Mask ventilation without difficulty LMA: LMA inserted LMA Size: 4.5 Placement Confirmation: positive ETCO2 and breath sounds checked- equal and bilateral Tube secured with: Tape Dental Injury: Teeth and Oropharynx as per pre-operative assessment

## 2014-11-11 NOTE — Brief Op Note (Signed)
10/26/2014 - 11/11/2014  3:40 PM  PATIENT:  Lance Cline  39 y.o. male  PRE-OPERATIVE DIAGNOSIS:  LEFT THIGH WOUND  POST-OPERATIVE DIAGNOSIS:  LEFT THIGH WOUND  PROCEDURE:  Procedure(s): 1. PREPARATION OF RECIPIENT SITE FOR SPLIT THICKNESS SKIN GRAFT 2. IRRIGATION AND DEBRIDEMENT LEFT THIGH WITH EXCISION SKIN, SUBCU, FASCIA  (Left) 3. LEFT THIGH APPLICATION OF WOUND VAC, LARGE  (Left)  SURGEON:  Surgeon(s) and Role:    * Myrene GalasMichael Vicky Schleich, MD - Primary  PHYSICIAN ASSISTANT: [name], PA-C  ANESTHESIA:   general  I/O:  Total I/O In: 850 [I.V.:850] Out: 50 [Blood:50]  SPECIMEN:  No Specimen  TOURNIQUET:  * No tourniquets in log *  DICTATION: .Other Dictation: Dictation Number [id number]
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
## 2014-11-11 NOTE — Anesthesia Postprocedure Evaluation (Signed)
  Anesthesia Post-op Note  Patient: Lance Cline  Procedure(s) Performed: Procedure(s): IRRIGATION AND DEBRIDEMENT LEFT THIGH (Left) LEFT THIGH APPLICATION OF WOUND VAC  (Left)  Patient Location: PACU  Anesthesia Type:General  Level of Consciousness: awake  Airway and Oxygen Therapy: Patient Spontanous Breathing  Post-op Pain: mild, moderate  Post-op Assessment: Post-op Vital signs reviewed, Patient's Cardiovascular Status Stable, Respiratory Function Stable, Patent Airway, No signs of Nausea or vomiting and Pain level controlled  Post-op Vital Signs: stable  Last Vitals:  Filed Vitals:   11/11/14 1508  BP:   Pulse:   Temp: 36.6 C  Resp:     Complications: No apparent anesthesia complications

## 2014-11-12 ENCOUNTER — Encounter (HOSPITAL_COMMUNITY): Payer: Self-pay | Admitting: Orthopedic Surgery

## 2014-11-12 LAB — TYPE AND SCREEN
ABO/RH(D): AB POS
Antibody Screen: NEGATIVE
UNIT DIVISION: 0
UNIT DIVISION: 0
UNIT DIVISION: 0
UNIT DIVISION: 0
Unit division: 0
Unit division: 0
Unit division: 0
Unit division: 0
Unit division: 0

## 2014-11-12 NOTE — Progress Notes (Signed)
PT Cancellation Note  Patient Details Name: Lance FactorBobby Borkenhagen MRN: [id number] DOB: 13-Oct-1975   Cancelled Treatment:    Reason Eval/Treat Not Completed: Pain limiting ability to participate.Will reattempt at 2pm this afternoon   Robinette, Adline PotterJulia Elizabeth 11/12/2014, 11:09 AM

## 2014-11-12 NOTE — Clinical Social Work Note (Signed)
Clinical Social Worker continuing to follow patient and family for support and discharge planning needs.  Patient has plans to return to the OR on Thursday and hopeful for inpatient rehab following.  CSW spoke with inpatient rehab admissions coordinator who has been able to follow up with family and plans to admit patient to inpatient rehab once medically stable.  Clinical Social Worker will sign off for now as social work intervention is no longer needed. Please consult us again if new need arises.  Macario GoldsJesse Prestyn Mahn, [place]CS[place] [phone]
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
## 2014-11-12 NOTE — Care Management Note (Signed)
Case Management Note  Patient Details  Name: Lance Cline MRN: [id number] Date of Birth: [birth year]  Subjective/Objective:     Pt to OR yesterday for preparation of wound for skin graft; wound VAC application.  Plan to return to OR on Thursday 5/26 for skin grafting.                 Action/Plan: Hopeful for dc to CIR early next week.  Will follow progress.    Expected Discharge Date:   (unknown)               Expected Discharge Plan:  IP Rehab Facility (To be determined--will be based on weight-bearing restrictions and pt's ability to manage, amount of assist from friends/family)  In-House Referral:  Clinical Social Work  Discharge planning Services  CM Consult  Post Acute Care Choice:    Choice offered to:     DME Arranged:    DME Agency:     HH Arranged:    HH Agency:     Status of Service:  In process, will continue to follow  Medicare Important Message Given:  No Date Medicare IM Given:    Medicare IM give by:    Date Additional Medicare IM Given:    Additional Medicare Important Message give by:     If discussed at Long Length of Stay Meetings, dates discussed:    Additional Comments:  Quintella BatonJulie W. Tove Wideman, RN, BSN  Trauma/Neuro ICU Case Manager [phone]
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
## 2014-11-12 NOTE — Progress Notes (Signed)
Noted more surgery planned. I will continue to follow and await medical readiness to admit pt to inpt rehab. [phone]
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
## 2014-11-12 NOTE — Progress Notes (Signed)
Orthopaedic Trauma Service Progress Note  Subjective  Doing well, no complaints   ROS As above  Objective   BP 117/71 mmHg  Pulse 87  Temp(Src) 99.1 F (37.3 C) (Oral)  Resp 17  Ht 5\' 11"  (1.803 m)  Wt 101.9 kg (224 lb 10.4 oz)  BMI 31.35 kg/m2  SpO2 100%  Intake/Output      05/23 0701 - 05/24 0700 05/24 0701 - 05/25 0700   P.O. 240    I.V. (mL/kg) 1170 (11.5)    IV Piggyback 400    Total Intake(mL/kg) 1810 (17.8)    Urine (mL/kg/hr) 5600 (2.3)    Drains 100 (0)    Stool     Blood 50 (0)    Total Output 5750     Net -3940            Exam   Gen: awake, alert, NAD, lying in bed   Pelvis: stoppa and SW incisions look great, no signs of infection.  Ext:        Left Lower extremity               Vac in place               Motor and sensory functions grossly intact             Ext warm             + DP pulse             Swelling controlled   Assessment and Plan   POD/HD#:1   39 y/o [facility]   1. B femur fractures s/p IMN     L anterior column, anterior wall acetabulum fracture s/p ORIF       CM pelvic ring fracture, L side     Complex degloving injury L thigh               Return to OR thurs for possible STSG L thigh               PT/OT evals             NWB B lex x 8-12 weeks given constellation of fractures               Unrestricted ROM of all LEx joints                 2. Pain management:             per TS   3. ABL anemia/Hemodynamics             Stable for now               4. Medical issues               Per TS  5. DVT/PE prophylaxis:             Lovenox             Convert to coumadin once surgeries are complete   6. ID:                Unasyn x 28 days total               Unasyn day 10 of 28             Long term levaquin after unasyn course  Appreciate ID assistance                 7. Dispo:             Return to OR thurs              Mearl Latin, PA-C [facility] [phone] (O) 11/12/2014 10:46 AM

## 2014-11-13 NOTE — Progress Notes (Signed)
Patient ID: Lance Cline, male   DOB: 12-09-1975, 39 y.o.   MRN: [id number]   LOS: 18 days   Subjective: No c/o   Objective: Vital signs in last 24 hours: Temp:  [98.6 F (37 C)-99.5 F (37.5 C)] 98.6 F (37 C) (05/25 0628) Pulse Rate:  [85-94] 94 (05/25 0628) Resp:  [16-18] 16 (05/25 0628) BP: (111-124)/(63-69) 124/69 mmHg (05/25 0628) SpO2:  [100 %] 100 % (05/25 0628) Last BM Date: 11/12/14   Physical Exam General appearance: alert and no distress Resp: clear to auscultation bilaterally Cardio: regular rate and rhythm GI: normal findings: bowel sounds normal and soft, non-tender   [facility] BLEs x8-12 weeks LLE degloving injury -- VAC, for STSG on Thursday Bilateral femur fxs s/p IMN Left fibula fx ABL anemia -- Stable ID-unasyn D#11/28, then levaquin FEN -- No issues VTE -- Lovenox, eventual coumadin Dispo -- OR on Thursday    Freeman CaldronMichael J. Otillia Cordone, PA-C Pager: [phone] General Trauma PA Pager: [phone]  11/13/2014

## 2014-11-13 NOTE — Consult Note (Signed)
WOC assistance requested for Vac leaking.  Pt states he accidentally pulled the trackpad off the Vac dressing to left leg.  Replaced Vac trackpad and cannister, which was full with 500cc yellow drainage.  Good seal obtained and mod amt yellow drainage in the new cannister.  EMR indicates that the patient will have Vac dressing changed in the OR tomorrow and he is followed by the ortho service. Please re-consult if further assistance is needed.  Thank-you,  Cammie Mcgeeawn Amron Guerrette MSN, RN, CWOCN, [place]CN-AP, CNS [phone]
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
## 2014-11-14 ENCOUNTER — Inpatient Hospital Stay (HOSPITAL_COMMUNITY): Payer: Self-pay | Admitting: Anesthesiology

## 2014-11-14 ENCOUNTER — Encounter (HOSPITAL_COMMUNITY): Admission: EM | Disposition: A | Discharge status: Rehabilitation Facility | Payer: Self-pay | Source: Home / Self Care

## 2014-11-14 ENCOUNTER — Inpatient Hospital Stay (HOSPITAL_COMMUNITY): Payer: MEDICAID | Admitting: Anesthesiology

## 2014-11-14 HISTORY — PX: I&D EXTREMITY: SHX5045

## 2014-11-14 HISTORY — PX: SKIN SPLIT GRAFT: SHX444

## 2014-11-14 LAB — URINE MICROSCOPIC-ADD ON

## 2014-11-14 LAB — URINALYSIS, ROUTINE W REFLEX MICROSCOPIC
Bilirubin Urine: NEGATIVE
Glucose, UA: NEGATIVE mg/dL
Hgb urine dipstick: NEGATIVE
Ketones, ur: NEGATIVE mg/dL
NITRITE: NEGATIVE
Protein, ur: NEGATIVE mg/dL
SPECIFIC GRAVITY, URINE: 1.016 (ref 1.005–1.030)
Urobilinogen, UA: 4 mg/dL — ABNORMAL HIGH (ref 0.0–1.0)
pH: 7.5 (ref 5.0–8.0)

## 2014-11-14 SURGERY — IRRIGATION AND DEBRIDEMENT EXTREMITY
Anesthesia: General | Laterality: Left

## 2014-11-14 MED ORDER — MINERAL OIL LIGHT 100 % EX OIL
TOPICAL_OIL | CUTANEOUS | Status: DC | PRN
  Administered 2014-11-14: 2 via TOPICAL

## 2014-11-14 MED ORDER — HYDROMORPHONE 0.3 MG/ML IV SOLN
INTRAVENOUS | Status: DC
  Administered 2014-11-14: 20:00:00 via INTRAVENOUS
  Administered 2014-11-15: 1.6 mg via INTRAVENOUS
  Administered 2014-11-15: 1.5 mg via INTRAVENOUS
  Administered 2014-11-15: 3.3 mg via INTRAVENOUS
  Administered 2014-11-15: 10:00:00 via INTRAVENOUS
  Administered 2014-11-15: 0.6 mg via INTRAVENOUS
  Administered 2014-11-16: 21:00:00 via INTRAVENOUS
  Administered 2014-11-16: 1.2 mg via INTRAVENOUS
  Administered 2014-11-16: 3 mg via INTRAVENOUS
  Administered 2014-11-16: 03:00:00 via INTRAVENOUS
  Administered 2014-11-16: 2.7 mg via INTRAVENOUS
  Administered 2014-11-17: 18:00:00 via INTRAVENOUS
  Administered 2014-11-17: 3.3 mg via INTRAVENOUS
  Administered 2014-11-17: 1.8 mg via INTRAVENOUS
  Administered 2014-11-17: 1.2 mg via INTRAVENOUS
  Administered 2014-11-17: 0 mg via INTRAVENOUS
  Administered 2014-11-17: 0.9 mg via INTRAVENOUS
  Administered 2014-11-17: 1.6 mg via INTRAVENOUS
  Administered 2014-11-18: 0.6 mg via INTRAVENOUS
  Administered 2014-11-18: 2.7 mg via INTRAVENOUS
  Administered 2014-11-18: 2.6 mg via INTRAVENOUS
  Administered 2014-11-18: 1.6 mg via INTRAVENOUS
  Administered 2014-11-18: 2.7 mg via INTRAVENOUS
  Administered 2014-11-18: 3.6 mg via INTRAVENOUS
  Administered 2014-11-18: 05:00:00 via INTRAVENOUS
  Administered 2014-11-18: 1.5 mg via INTRAVENOUS
  Administered 2014-11-18: 0.6 mg via INTRAVENOUS
  Administered 2014-11-19: 2.1 mg via INTRAVENOUS
  Administered 2014-11-19: 1.8 mg via INTRAVENOUS
  Filled 2014-11-14 (×7): qty 25

## 2014-11-14 MED ORDER — NALOXONE HCL 0.4 MG/ML IJ SOLN: 0.4000 mg | INTRAMUSCULAR | Status: DC | PRN

## 2014-11-14 MED ORDER — FENTANYL CITRATE (PF) 250 MCG/5ML IJ SOLN
INTRAMUSCULAR | Status: AC
  Filled 2014-11-14: qty 5

## 2014-11-14 MED ORDER — ONDANSETRON HCL 4 MG/2ML IJ SOLN: 4.0000 mg | Freq: Four times a day (QID) | INTRAMUSCULAR | Status: DC | PRN

## 2014-11-14 MED ORDER — DIPHENHYDRAMINE HCL 12.5 MG/5ML PO ELIX: 12.5000 mg | ORAL_SOLUTION | Freq: Four times a day (QID) | ORAL | Status: DC | PRN

## 2014-11-14 MED ORDER — ONDANSETRON HCL 4 MG/2ML IJ SOLN: 4.0000 mg | Freq: Once | INTRAMUSCULAR | Status: AC | PRN

## 2014-11-14 MED ORDER — BUPIVACAINE-EPINEPHRINE (PF) 0.25% -1:200000 IJ SOLN
INTRAMUSCULAR | Status: AC
  Filled 2014-11-14: qty 90

## 2014-11-14 MED ORDER — ONDANSETRON HCL 4 MG/2ML IJ SOLN
INTRAMUSCULAR | Status: AC
  Filled 2014-11-14: qty 2

## 2014-11-14 MED ORDER — DIPHENHYDRAMINE HCL 50 MG/ML IJ SOLN: 12.5000 mg | Freq: Four times a day (QID) | INTRAMUSCULAR | Status: DC | PRN

## 2014-11-14 MED ORDER — FENTANYL CITRATE (PF) 100 MCG/2ML IJ SOLN
INTRAMUSCULAR | Status: DC | PRN
  Administered 2014-11-14: 100 ug via INTRAVENOUS
  Administered 2014-11-14: 50 ug via INTRAVENOUS
  Administered 2014-11-14 (×2): 25 ug via INTRAVENOUS

## 2014-11-14 MED ORDER — FENTANYL CITRATE (PF) 100 MCG/2ML IJ SOLN
25.0000 ug | INTRAMUSCULAR | Status: DC | PRN
  Administered 2014-11-14 (×2): 50 ug via INTRAVENOUS

## 2014-11-14 MED ORDER — SODIUM CHLORIDE 0.9 % IJ SOLN: 9.0000 mL | INTRAMUSCULAR | Status: DC | PRN

## 2014-11-14 MED ORDER — FENTANYL CITRATE (PF) 100 MCG/2ML IJ SOLN
INTRAMUSCULAR | Status: AC
  Administered 2014-11-14: 50 ug via INTRAVENOUS
  Filled 2014-11-14: qty 2

## 2014-11-14 MED ORDER — LIDOCAINE HCL (CARDIAC) 20 MG/ML IV SOLN
INTRAVENOUS | Status: AC
  Filled 2014-11-14: qty 5

## 2014-11-14 MED ORDER — HYDROMORPHONE 0.3 MG/ML IV SOLN
INTRAVENOUS | Status: AC
  Filled 2014-11-14: qty 25

## 2014-11-14 MED ORDER — MIDAZOLAM HCL 2 MG/2ML IJ SOLN: 2.0000 mg | Freq: Once | INTRAMUSCULAR | Status: DC

## 2014-11-14 MED ORDER — PROPOFOL 10 MG/ML IV BOLUS
INTRAVENOUS | Status: DC | PRN
  Administered 2014-11-14: 150 mg via INTRAVENOUS

## 2014-11-14 MED ORDER — MINERAL OIL LIGHT 100 % EX OIL
TOPICAL_OIL | CUTANEOUS | Status: AC
  Filled 2014-11-14: qty 100

## 2014-11-14 MED ORDER — ONDANSETRON HCL 4 MG/2ML IJ SOLN
INTRAMUSCULAR | Status: DC | PRN
  Administered 2014-11-14: 4 mg via INTRAVENOUS

## 2014-11-14 MED ORDER — PROPOFOL 10 MG/ML IV BOLUS
INTRAVENOUS | Status: AC
  Filled 2014-11-14: qty 20

## 2014-11-14 MED ORDER — MIDAZOLAM HCL 2 MG/2ML IJ SOLN
INTRAMUSCULAR | Status: AC
  Filled 2014-11-14: qty 2

## 2014-11-14 MED ORDER — MIDAZOLAM HCL 5 MG/5ML IJ SOLN
INTRAMUSCULAR | Status: DC | PRN
  Administered 2014-11-14: 2 mg via INTRAVENOUS

## 2014-11-14 MED ORDER — BUPIVACAINE-EPINEPHRINE 0.25% -1:200000 IJ SOLN
INTRAMUSCULAR | Status: DC | PRN
  Administered 2014-11-14 (×2): 60 mL

## 2014-11-14 MED ORDER — SODIUM CHLORIDE 0.9 % IR SOLN
Status: DC | PRN
  Administered 2014-11-14 (×2): 3000 mL

## 2014-11-14 MED ORDER — ROCURONIUM BROMIDE 100 MG/10ML IV SOLN
INTRAVENOUS | Status: DC | PRN
  Administered 2014-11-14: 40 mg via INTRAVENOUS

## 2014-11-14 SURGICAL SUPPLY — 71 items
BALL CTTN LRG ABS STRL LF (GAUZE/BANDAGES/DRESSINGS) ×2
BANDAGE ELASTIC 4 VELCRO ST LF (GAUZE/BANDAGES/DRESSINGS) ×2 IMPLANT
BANDAGE ELASTIC 6 VELCRO ST LF (GAUZE/BANDAGES/DRESSINGS) ×4 IMPLANT
BLADE DERMATOME II (BLADE) ×4 IMPLANT
BLADE SURG 10 STRL SS (BLADE) ×6 IMPLANT
BLADE SURG ROTATE 9660 (MISCELLANEOUS) IMPLANT
BNDG COHESIVE 4X5 TAN STRL (GAUZE/BANDAGES/DRESSINGS) ×3 IMPLANT
BNDG COHESIVE 6X5 TAN STRL LF (GAUZE/BANDAGES/DRESSINGS) ×2 IMPLANT
BNDG GAUZE ELAST 4 BULKY (GAUZE/BANDAGES/DRESSINGS) ×4 IMPLANT
BNDG GAUZE STRTCH 6 (GAUZE/BANDAGES/DRESSINGS) ×9 IMPLANT
BRUSH SCRUB DISP (MISCELLANEOUS) ×6 IMPLANT
CANISTER SUCTION 2500CC (MISCELLANEOUS) IMPLANT
COTTONBALL LRG STERILE PKG (GAUZE/BANDAGES/DRESSINGS) ×6 IMPLANT
COVER SURGICAL LIGHT HANDLE (MISCELLANEOUS) ×6 IMPLANT
DERMACARRIERS GRAFT 1 TO 1.5 (DISPOSABLE) ×9
DRAPE C-ARMOR (DRAPES) ×3 IMPLANT
DRAPE EXTREMITY T 121X128X90 (DRAPE) IMPLANT
DRAPE PROXIMA HALF (DRAPES) ×6 IMPLANT
DRAPE U-SHAPE 47X51 STRL (DRAPES) ×3 IMPLANT
DRSG ADAPTIC 3X8 NADH LF (GAUZE/BANDAGES/DRESSINGS) ×3 IMPLANT
DRSG PAD ABDOMINAL 8X10 ST (GAUZE/BANDAGES/DRESSINGS) ×4 IMPLANT
DRSG VAC ATS LRG SENSATRAC (GAUZE/BANDAGES/DRESSINGS) ×8 IMPLANT
ELECT CAUTERY BLADE 6.4 (BLADE) IMPLANT
ELECT REM PT RETURN 9FT ADLT (ELECTROSURGICAL)
ELECTRODE REM PT RTRN 9FT ADLT (ELECTROSURGICAL) IMPLANT
GAUZE SPONGE 4X4 12PLY STRL (GAUZE/BANDAGES/DRESSINGS) ×4 IMPLANT
GAUZE SPONGE 4X4 16PLY XRAY LF (GAUZE/BANDAGES/DRESSINGS) ×3 IMPLANT
GAUZE XEROFORM 5X9 LF (GAUZE/BANDAGES/DRESSINGS) ×10 IMPLANT
GLOVE BIO SURGEON STRL SZ7.5 (GLOVE) ×3 IMPLANT
GLOVE BIO SURGEON STRL SZ8 (GLOVE) ×6 IMPLANT
GLOVE BIOGEL PI IND STRL 7.5 (GLOVE) ×1 IMPLANT
GLOVE BIOGEL PI IND STRL 8 (GLOVE) ×1 IMPLANT
GLOVE BIOGEL PI INDICATOR 7.5 (GLOVE) ×2
GLOVE BIOGEL PI INDICATOR 8 (GLOVE) ×4
GOWN STRL REUS W/ TWL LRG LVL3 (GOWN DISPOSABLE) ×2 IMPLANT
GOWN STRL REUS W/ TWL XL LVL3 (GOWN DISPOSABLE) ×1 IMPLANT
GOWN STRL REUS W/TWL 2XL LVL3 (GOWN DISPOSABLE) IMPLANT
GOWN STRL REUS W/TWL LRG LVL3 (GOWN DISPOSABLE) ×6
GOWN STRL REUS W/TWL XL LVL3 (GOWN DISPOSABLE) ×3
GRAFT DERMACARRIERS 1 TO 1.5 (DISPOSABLE) IMPLANT
HANDPIECE INTERPULSE COAX TIP (DISPOSABLE)
KIT BASIN OR (CUSTOM PROCEDURE TRAY) ×3 IMPLANT
KIT ROOM TURNOVER OR (KITS) ×3 IMPLANT
MANIFOLD NEPTUNE II (INSTRUMENTS) ×3 IMPLANT
NS IRRIG 1000ML POUR BTL (IV SOLUTION) ×3 IMPLANT
PACK GENERAL/GYN (CUSTOM PROCEDURE TRAY) ×3 IMPLANT
PACK ORTHO EXTREMITY (CUSTOM PROCEDURE TRAY) ×3 IMPLANT
PAD ABD 8X10 STRL (GAUZE/BANDAGES/DRESSINGS) ×16 IMPLANT
PAD ARMBOARD 7.5X6 YLW CONV (MISCELLANEOUS) ×6 IMPLANT
PAD CAST 4YDX4 CTTN HI CHSV (CAST SUPPLIES) ×1 IMPLANT
PADDING CAST COTTON 4X4 STRL (CAST SUPPLIES) ×3
PADDING CAST COTTON 6X4 STRL (CAST SUPPLIES) ×3 IMPLANT
PENCIL BUTTON HOLSTER BLD 10FT (ELECTRODE) ×3 IMPLANT
SET HNDPC FAN SPRY TIP SCT (DISPOSABLE) IMPLANT
SET IRRIG Y TYPE TUR BLADDER L (SET/KITS/TRAYS/PACK) ×2 IMPLANT
SPONGE LAP 18X18 X RAY DECT (DISPOSABLE) ×7 IMPLANT
SPONGE SCRUB IODOPHOR (GAUZE/BANDAGES/DRESSINGS) ×3 IMPLANT
STAPLER VISISTAT (STAPLE) ×3 IMPLANT
STAPLER VISISTAT 35W (STAPLE) ×11 IMPLANT
STOCKINETTE IMPERVIOUS 9X36 MD (GAUZE/BANDAGES/DRESSINGS) ×6 IMPLANT
STOCKINETTE IMPERVIOUS LG (DRAPES) IMPLANT
SUT ETHILON 3 0 FSL (SUTURE) ×3 IMPLANT
SUT PDS AB 2-0 CT1 27 (SUTURE) IMPLANT
TOWEL OR 17X24 6PK STRL BLUE (TOWEL DISPOSABLE) ×3 IMPLANT
TOWEL OR 17X26 10 PK STRL BLUE (TOWEL DISPOSABLE) ×6 IMPLANT
TUBE ANAEROBIC SPECIMEN COL (MISCELLANEOUS) IMPLANT
TUBE CONNECTING 12'X1/4 (SUCTIONS) ×1
TUBE CONNECTING 12X1/4 (SUCTIONS) ×2 IMPLANT
UNDERPAD 30X30 INCONTINENT (UNDERPADS AND DIAPERS) ×6 IMPLANT
WATER STERILE IRR 1000ML POUR (IV SOLUTION) ×3 IMPLANT
YANKAUER SUCT BULB TIP NO VENT (SUCTIONS) ×3 IMPLANT

## 2014-11-14 NOTE — Progress Notes (Signed)
Planning for STSG today for left thigh.  I have discussed with the patient the risks and benefits of surgery, including the possibility of infection, scarring, persistent pain, failure of graft take, DVT/ PE, loss of motion, and need for further surgery among others.  He understood these risks and wished to proceed.  Lance GalasMichael Iram Astorino, MD [facility] [phone] (p)

## 2014-11-14 NOTE — Anesthesia Procedure Notes (Signed)
Procedure Name: Intubation Date/Time: 11/14/2014 5:30 PM Performed by: Renford DillsMULLINS, Raeley Gilmore L Pre-anesthesia Checklist: Patient identified, Emergency Drugs available, Suction available and Patient being monitored Patient Re-evaluated:Patient Re-evaluated prior to inductionOxygen Delivery Method: Circle system utilized Preoxygenation: Pre-oxygenation with 100% oxygen Intubation Type: IV induction Laryngoscope Size: Miller and 2 Grade View: Grade II Tube type: Oral Tube size: 8.0 mm Number of attempts: 1 Airway Equipment and Method: Stylet Placement Confirmation: ETT inserted through vocal cords under direct vision,  breath sounds checked- equal and bilateral and positive ETCO2 Secured at: 22 cm Tube secured with: Tape Dental Injury: Teeth and Oropharynx as per pre-operative assessment

## 2014-11-14 NOTE — Anesthesia Postprocedure Evaluation (Signed)
  Anesthesia Post-op Note  Patient: Lance Cline  Procedure(s) Performed: Procedure(s): IRRIGATION AND DEBRIDEMENT EXTREMITY (Left) SKIN GRAFT SPLIT THICKNESS (Left)  Patient Location: PACU  Anesthesia Type:General  Level of Consciousness: awake, alert , oriented and patient cooperative  Airway and Oxygen Therapy: Patient Spontanous Breathing  Post-op Pain: mild  Post-op Assessment: Post-op Vital signs reviewed, Patient's Cardiovascular Status Stable, Respiratory Function Stable, Patent Airway, No signs of Nausea or vomiting and Pain level controlled  Post-op Vital Signs: stable  Last Vitals:  Filed Vitals:   11/14/14 2030  BP:   Pulse:   Temp: 36.6 C  Resp:     Complications: No apparent anesthesia complications

## 2014-11-14 NOTE — Progress Notes (Signed)
PT Cancellation Note  Patient Details Name: Lance Cline MRN: [id number] DOB: 1975/12/30   Cancelled Treatment:    Reason Eval/Treat Not Completed: Patient at procedure or test/unavailable. TO OR Later today   Fredrich BirksRobinette, Julia Elizabeth 11/14/2014, 11:19 AM
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
## 2014-11-14 NOTE — Transfer of Care (Signed)
Immediate Anesthesia Transfer of Care Note  Patient: Lance Cline  Procedure(s) Performed: Procedure(s): IRRIGATION AND DEBRIDEMENT EXTREMITY (Left) SKIN GRAFT SPLIT THICKNESS (Left)  Patient Location: PACU  Anesthesia Type:General  Level of Consciousness: awake, alert , oriented and patient cooperative  Airway & Oxygen Therapy: Patient Spontanous Breathing and Patient connected to nasal cannula oxygen  Post-op Assessment: Report given to RN, Post -op Vital signs reviewed and stable and Patient moving all extremities  Post vital signs: Reviewed and stable  Last Vitals:  Filed Vitals:   11/14/14 0430  BP: 118/73  Pulse: 89  Temp: 37.1 C  Resp: 16    Complications: No apparent anesthesia complications

## 2014-11-14 NOTE — Progress Notes (Signed)
[facility].  Patient prepped for surgery. Consent on chart. Pre-op checklist complete.  CHG bath this AM.  Patient eager for surgery.

## 2014-11-14 NOTE — Care Management Note (Signed)
Case Management Note  Patient Details  Name: Lance Cline MRN: [id number] Date of Birth: [birth year]  Subjective/Objective:     Pt back to OR today for skin grafting.                Action/Plan: Hopeful for dc to IP rehab when medically stable.    Expected Discharge Date:   (unknown)               Expected Discharge Plan:  IP Rehab Facility (To be determined--will be based on weight-bearing restrictions and pt's ability to manage, amount of assist from friends/family)  In-House Referral:  Clinical Social Work  Discharge planning Services  CM Consult  Post Acute Care Choice:    Choice offered to:     DME Arranged:    DME Agency:     HH Arranged:    HH Agency:     Status of Service:  In process, will continue to follow  Medicare Important Message Given:  No Date Medicare IM Given:    Medicare IM give by:    Date Additional Medicare IM Given:    Additional Medicare Important Message give by:     If discussed at Long Length of Stay Meetings, dates discussed:  10/31/14; 11/05/14; 11/07/14; 11/12/14; 11/14/14  Additional Comments:  Quintella BatonJulie W. Thi Klich, RN, BSN  Trauma/Neuro ICU Case Manager [phone]
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
## 2014-11-15 ENCOUNTER — Encounter (HOSPITAL_COMMUNITY): Payer: Self-pay | Admitting: Orthopedic Surgery

## 2014-11-15 MED ORDER — WARFARIN - PHARMACIST DOSING INPATIENT
Freq: Every day | Status: DC
  Administered 2014-11-15: 17:00:00

## 2014-11-15 MED ORDER — COUMADIN BOOK
Freq: Once | Status: AC
  Administered 2014-11-15: 13:00:00
  Filled 2014-11-15: qty 1

## 2014-11-15 MED ORDER — WARFARIN SODIUM 5 MG PO TABS
10.0000 mg | ORAL_TABLET | Freq: Once | ORAL | Status: AC
  Administered 2014-11-15: 10 mg via ORAL
  Filled 2014-11-15: qty 2

## 2014-11-15 MED ORDER — WARFARIN VIDEO
Freq: Once | Status: AC
  Administered 2014-11-15: 13:00:00

## 2014-11-15 NOTE — Progress Notes (Signed)
ANTICOAGULATION CONSULT NOTE - Initial Consult  Pharmacy Consult for coumadin Indication: VTE prophylaxis  No Known Allergies  Patient Measurements: Height: 5\' 11"  (180.3 cm) Weight: 224 lb 10.4 oz (101.9 kg) IBW/kg (Calculated) : 75.3   Vital Signs: Temp: 99.3 F (37.4 C) (05/27 0602) Temp Source: Oral (05/27 0602) BP: 114/61 mmHg (05/27 0602) Pulse Rate: 87 (05/27 0602)  Labs: No results for input(s): HGB, HCT, PLT, APTT, LABPROT, INR, HEPARINUNFRC, CREATININE, CKTOTAL, CKMB, TROPONINI in the last 72 hours.  Estimated Creatinine Clearance: 124.3 mL/min (by C-G formula based on Cr of 0.95).   Medical History: Past Medical History  Diagnosis Date  . Medical history non-contributory     Medications:  No prescriptions prior to admission    Assessment: 39 yo man to start coumadin for VTE px.  [facility]  He is currently on lovenox 30 mg sq q12 hours. Goal of Therapy:  INR 2-3 Monitor platelets by anticoagulation protocol: Yes   Plan:  Coumadin 10 mg po today Daily PT/INR Coumadin education D/C lovenox when INR therapeutic  Thanks for allowing pharmacy to be a part of this patient's care.  Talbert CageLora Meriam Chojnowski, PharmD Clinical Pharmacist, [phone]  11/15/2014,10:03 AM

## 2014-11-15 NOTE — Progress Notes (Signed)
OT Cancellation Note  Patient Details Name: Lance Cline MRN: [id number] DOB: Feb 10, 1976   Cancelled Treatment:    Reason Eval/Treat Not Completed: Patient not medically ready. [facility] with Lance C[facility] Carola FrostHandy and he does not want us to work with pt today--wants skin graft donor sites to dry out--said we could work with him tomorrow if they are totally dry.  Lance Cline, Lance Cline Eva [phone] 11/15/2014, 11:54 AM
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
## 2014-11-15 NOTE — Progress Notes (Signed)
I contacted [name], ortho PA, to clarify when pt likely medically ready for inpt rehab. VAC to remain until Monday. Likely ready for inpt rehab admission Tuesday. [phone]
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
## 2014-11-15 NOTE — Brief Op Note (Signed)
10/26/2014 - 11/14/2014  8:36 AM  PATIENT:  Dola FactorBobby Meacham  39 y.o. male  PRE-OPERATIVE DIAGNOSIS:  Left leg degloving  POST-OPERATIVE DIAGNOSIS:  Left leg degloving  PROCEDURE:  Procedure(s): 1. IRRIGATION AND DEBRIDEMENT EXTREMITY (Left) 2. SKIN GRAFT SPLIT THICKNESS (Left) 37 x 35 cm  SURGEON:  Surgeon(s) and Role:    * Myrene GalasMichael Mehar Kirkwood, MD - Primary  PHYSICIAN ASSISTANT: [name], PA-C  ANESTHESIA:   general  I/O:     SPECIMEN:  No Specimen  TOURNIQUET:    DICTATION: .Other Dictation: Dictation Number [id number]

## 2014-11-16 LAB — PROTIME-INR
INR: 1.15 (ref 0.00–1.49)
PROTHROMBIN TIME: 14.8 s (ref 11.6–15.2)

## 2014-11-16 MED ORDER — WARFARIN SODIUM 5 MG PO TABS
10.0000 mg | ORAL_TABLET | Freq: Every day | ORAL | Status: DC
  Administered 2014-11-16 – 2014-11-17 (×2): 10 mg via ORAL
  Filled 2014-11-16 (×2): qty 2
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
## 2014-11-16 NOTE — Progress Notes (Signed)
PT Cancellation Note  Patient Details Name: Lance Cline MRN: [id number] DOB: 06-13-1976   Cancelled Treatment:    Reason Eval/Treat Not Completed: Medical issues which prohibited therapy Pt continues to be on bedrest per MD due to awaiting drying of skin graft site. Will follow up when appropriate.  [name] 11/16/2014, 9:02 AM [name] Penn Grissett, PT, DPT [phone]
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
## 2014-11-16 NOTE — Discharge Instructions (Signed)
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
## 2014-11-17 LAB — CBC
HCT: 28.3 % — ABNORMAL LOW (ref 39.0–52.0)
Hemoglobin: 8.9 g/dL — ABNORMAL LOW (ref 13.0–17.0)
MCH: 27.8 pg (ref 26.0–34.0)
MCHC: 31.4 g/dL (ref 30.0–36.0)
MCV: 88.4 fL (ref 78.0–100.0)
Platelets: 405 10*3/uL — ABNORMAL HIGH (ref 150–400)
RBC: 3.2 MIL/uL — AB (ref 4.22–5.81)
RDW: 14.6 % (ref 11.5–15.5)
WBC: 8.5 10*3/uL (ref 4.0–10.5)

## 2014-11-17 LAB — COMPREHENSIVE METABOLIC PANEL
ALBUMIN: 1.9 g/dL — AB (ref 3.5–5.0)
ALT: 42 U/L (ref 17–63)
AST: 39 U/L (ref 15–41)
Alkaline Phosphatase: 248 U/L — ABNORMAL HIGH (ref 38–126)
Anion gap: 5 (ref 5–15)
CO2: 29 mmol/L (ref 22–32)
Calcium: 8 mg/dL — ABNORMAL LOW (ref 8.9–10.3)
Chloride: 98 mmol/L — ABNORMAL LOW (ref 101–111)
Creatinine, Ser: 0.75 mg/dL (ref 0.61–1.24)
GFR calc Af Amer: >60 mL/min (ref >60)
GFR calc non Af Amer: >60 mL/min (ref >60)
Glucose, Bld: 176 mg/dL — ABNORMAL HIGH (ref 65–99)
Potassium: 4 mmol/L (ref 3.5–5.1)
Sodium: 132 mmol/L — ABNORMAL LOW (ref 135–145)
Total Bilirubin: 0.5 mg/dL (ref 0.3–1.2)
Total Protein: 6.6 g/dL (ref 6.5–8.1)

## 2014-11-17 LAB — PROTIME-INR
INR: 1.29 (ref 0.00–1.49)
PROTHROMBIN TIME: 16.2 s — AB (ref 11.6–15.2)

## 2014-11-17 NOTE — Progress Notes (Signed)
PT Cancellation Note  Patient Details Name: Lance FactorBobby Teng MRN: [id number] DOB: [birth year]   Cancelled Treatment:    Reason Eval/Treat Not Completed: Medical issues which prohibited therapy  Pt continues to be on bedrest per MD due to awaiting drying of skin graft site. Will continue to follow along and await increased activity orders.    Donnella ShamSawulski, Rhonda Vangieson J  11/17/2014, 11:15 AM  Lavona MoundMark Essense Bousquet, PT  [phone] 11/17/2014
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
## 2014-11-18 LAB — PROTIME-INR
INR: 1.36 (ref 0.00–1.49)
Prothrombin Time: 16.9 s — ABNORMAL HIGH (ref 11.6–15.2)

## 2014-11-18 MED ORDER — WARFARIN SODIUM 7.5 MG PO TABS
12.5000 mg | ORAL_TABLET | Freq: Once | ORAL | Status: AC
  Administered 2014-11-18: 12.5 mg via ORAL
  Filled 2014-11-18 (×2): qty 1

## 2014-11-18 NOTE — Progress Notes (Signed)
PT Cancellation Note  Patient Details Name: Dola FactorBobby Twyman MRN: [id number] DOB: [birth year]   Cancelled Treatment:    Reason Eval/Treat Not Completed: Medical issues which prohibited therapy. Patient continues with bedrest orders per progress note. Will require an updated note or order to progress with activities.    Fredrich BirksRobinette, Ra Pfiester Elizabeth 11/18/2014, 2:07 PM
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
## 2014-11-18 NOTE — Progress Notes (Signed)
I continue to follow pt's case as we await medical readiness to admit to inpt rehab. [phone]

## 2014-11-19 LAB — PROTIME-INR
INR: 1.59 — ABNORMAL HIGH (ref 0.00–1.49)
Prothrombin Time: 19 s — ABNORMAL HIGH (ref 11.6–15.2)

## 2014-11-19 MED ORDER — WARFARIN SODIUM 7.5 MG PO TABS
12.5000 mg | ORAL_TABLET | Freq: Once | ORAL | Status: AC
  Administered 2014-11-19: 12.5 mg via ORAL
  Filled 2014-11-19 (×2): qty 1

## 2014-11-19 NOTE — Progress Notes (Signed)
Incisions from Bilateral femur IMN have healed well. No signs of infection. Will have RN remove staples  N. Glee ArvinMichael Karry Causer, MD [facility] [phone] 12:17 PM
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
## 2014-11-19 NOTE — Progress Notes (Signed)
I met with pt at bedside. VAC removed. Pt remains on PCA. I can admit pt to inpt rehab once off PCA. Hopeful for tomorrow. [phone]

## 2014-11-19 NOTE — Progress Notes (Signed)
OT Cancellation Note  Patient Details Name: Dola FactorBobby Lucero MRN: [id number] DOB: April 05, 1976   Cancelled Treatment:    Reason Eval/Treat Not Completed: Other (comment) (VAC on STSG to come off today per Dr. Carola FrostHandy). Will follow-up for acute OT as able and appropriate.   Birtie Fellman , MS, OTR/L, CLT Pager: [phone]  11/19/2014, 10:27 AM

## 2014-11-19 NOTE — Progress Notes (Signed)
PT Cancellation Note  Patient Details Name: Lance Cline MRN: [id number] DOB: April 23, 1976   Cancelled Treatment:    Reason Eval/Treat Not Completed: Other (comment). Patient with VAC removal and STSG today. Will follow up as ordered by MD following procedure.    Fredrich BirksRobinette, Julia Elizabeth 11/19/2014, 10:06 AM
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
## 2014-11-19 NOTE — Care Management Note (Signed)
Case Management Note  Patient Details  Name: Dola FactorBobby Geesey MRN: [id number] Date of Birth: 1976-03-03  Subjective/Objective:     Pt progressing; VAC removed today.                 Action/Plan: Will be able to dc to CIR once PCA discontinued, hopefully 1-2 days.  Will follow progress.    Expected Discharge Date:   (unknown)               Expected Discharge Plan:  IP Rehab Facility (To be determined--will be based on weight-bearing restrictions and pt's ability to manage, amount of assist from friends/family)  In-House Referral:  Clinical Social Work  Discharge planning Services  CM Consult  Post Acute Care Choice:    Choice offered to:     DME Arranged:    DME Agency:     HH Arranged:    HH Agency:     Status of Service:  In process, will continue to follow  Medicare Important Message Given:  No Date Medicare IM Given:    Medicare IM give by:    Date Additional Medicare IM Given:    Additional Medicare Important Message give by:     If discussed at Long Length of Stay Meetings, dates discussed:    Additional Comments:  Quintella BatonJulie W. Tiya Schrupp, RN, BSN  Trauma/Neuro ICU Case Manager [phone]
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
## 2014-11-20 ENCOUNTER — Inpatient Hospital Stay (HOSPITAL_COMMUNITY)
Admission: RE | Admit: 2014-11-20 | Discharge: 2014-11-29 | DRG: 560 | Disposition: A | Discharge status: Home Health | Payer: Self-pay | Source: Intra-hospital | Attending: Physical Medicine & Rehabilitation | Admitting: Physical Medicine & Rehabilitation

## 2014-11-20 ENCOUNTER — Encounter (HOSPITAL_COMMUNITY): Payer: Self-pay | Admitting: *Deleted

## 2014-11-20 DIAGNOSIS — S32810D Multiple fractures of pelvis with stable disruption of pelvic ring, subsequent encounter for fracture with routine healing: Principal | ICD-10-CM

## 2014-11-20 DIAGNOSIS — D62 Acute posthemorrhagic anemia: Secondary | ICD-10-CM | POA: Diagnosis present

## 2014-11-20 DIAGNOSIS — T1490XA Injury, unspecified, initial encounter: Secondary | ICD-10-CM | POA: Insufficient documentation

## 2014-11-20 DIAGNOSIS — S32810S Multiple fractures of pelvis with stable disruption of pelvic ring, sequela: Secondary | ICD-10-CM

## 2014-11-20 DIAGNOSIS — K5900 Constipation, unspecified: Secondary | ICD-10-CM | POA: Diagnosis present

## 2014-11-20 DIAGNOSIS — S32810A Multiple fractures of pelvis with stable disruption of pelvic ring, initial encounter for closed fracture: Secondary | ICD-10-CM

## 2014-11-20 DIAGNOSIS — S81802D Unspecified open wound, left lower leg, subsequent encounter: Secondary | ICD-10-CM

## 2014-11-20 DIAGNOSIS — F1010 Alcohol abuse, uncomplicated: Secondary | ICD-10-CM | POA: Diagnosis present

## 2014-11-20 DIAGNOSIS — S3282XA Multiple fractures of pelvis without disruption of pelvic ring, initial encounter for closed fracture: Secondary | ICD-10-CM | POA: Diagnosis present

## 2014-11-20 DIAGNOSIS — S81802S Unspecified open wound, left lower leg, sequela: Secondary | ICD-10-CM

## 2014-11-20 DIAGNOSIS — S82402D Unspecified fracture of shaft of left fibula, subsequent encounter for closed fracture with routine healing: Secondary | ICD-10-CM

## 2014-11-20 DIAGNOSIS — S8412XA Injury of peroneal nerve at lower leg level, left leg, initial encounter: Secondary | ICD-10-CM | POA: Diagnosis present

## 2014-11-20 DIAGNOSIS — S82402S Unspecified fracture of shaft of left fibula, sequela: Secondary | ICD-10-CM

## 2014-11-20 DIAGNOSIS — S32402D Unspecified fracture of left acetabulum, subsequent encounter for fracture with routine healing: Secondary | ICD-10-CM

## 2014-11-20 DIAGNOSIS — S7292XA Unspecified fracture of left femur, initial encounter for closed fracture: Secondary | ICD-10-CM | POA: Diagnosis present

## 2014-11-20 DIAGNOSIS — S81802A Unspecified open wound, left lower leg, initial encounter: Secondary | ICD-10-CM | POA: Diagnosis present

## 2014-11-20 DIAGNOSIS — S7291XA Unspecified fracture of right femur, initial encounter for closed fracture: Secondary | ICD-10-CM | POA: Diagnosis present

## 2014-11-20 DIAGNOSIS — S7292XD Unspecified fracture of left femur, subsequent encounter for closed fracture with routine healing: Secondary | ICD-10-CM

## 2014-11-20 DIAGNOSIS — S7291XD Unspecified fracture of right femur, subsequent encounter for closed fracture with routine healing: Secondary | ICD-10-CM

## 2014-11-20 DIAGNOSIS — IMO0002 Reserved for concepts with insufficient information to code with codable children: Secondary | ICD-10-CM

## 2014-11-20 DIAGNOSIS — S82402A Unspecified fracture of shaft of left fibula, initial encounter for closed fracture: Secondary | ICD-10-CM | POA: Diagnosis present

## 2014-11-20 LAB — PROTIME-INR
INR: 2.24 — AB (ref 0.00–1.49)
Prothrombin Time: 24.6 seconds — ABNORMAL HIGH (ref 11.6–15.2)

## 2014-11-20 MED ORDER — WARFARIN SODIUM 10 MG PO TABS
10.0000 mg | ORAL_TABLET | Freq: Once | ORAL | Status: AC
  Administered 2014-11-20: 10 mg via ORAL
  Filled 2014-11-20: qty 1

## 2014-11-20 MED ORDER — MENTHOL 3 MG MT LOZG
1.0000 | LOZENGE | OROMUCOSAL | Status: DC | PRN
  Filled 2014-11-20: qty 9

## 2014-11-20 MED ORDER — SORBITOL 70 % SOLN: 30.0000 mL | Freq: Every day | Status: DC | PRN

## 2014-11-20 MED ORDER — OXYCODONE HCL 5 MG PO TABS
10.0000 mg | ORAL_TABLET | ORAL | Status: DC | PRN
  Administered 2014-11-20 – 2014-11-26 (×19): 20 mg via ORAL
  Filled 2014-11-20 (×23): qty 4

## 2014-11-20 MED ORDER — DOCUSATE SODIUM 100 MG PO CAPS
100.0000 mg | ORAL_CAPSULE | Freq: Two times a day (BID) | ORAL | Status: DC
  Administered 2014-11-20 – 2014-11-21 (×3): 100 mg via ORAL
  Filled 2014-11-20 (×6): qty 1

## 2014-11-20 MED ORDER — WARFARIN - PHARMACIST DOSING INPATIENT: Freq: Every day | Status: DC

## 2014-11-20 MED ORDER — ONDANSETRON HCL 4 MG PO TABS: 4.0000 mg | ORAL_TABLET | Freq: Four times a day (QID) | ORAL | Status: DC | PRN

## 2014-11-20 MED ORDER — METHOCARBAMOL 500 MG PO TABS
1000.0000 mg | ORAL_TABLET | Freq: Four times a day (QID) | ORAL | Status: DC
  Administered 2014-11-20 – 2014-11-25 (×18): 1000 mg via ORAL
  Filled 2014-11-20 (×28): qty 2

## 2014-11-20 MED ORDER — MORPHINE SULFATE ER 15 MG PO TBCR
15.0000 mg | EXTENDED_RELEASE_TABLET | Freq: Two times a day (BID) | ORAL | Status: DC
  Administered 2014-11-20 – 2014-11-21 (×2): 15 mg via ORAL
  Filled 2014-11-20 (×2): qty 1

## 2014-11-20 MED ORDER — POLYETHYLENE GLYCOL 3350 17 G PO PACK
17.0000 g | PACK | Freq: Two times a day (BID) | ORAL | Status: DC
  Administered 2014-11-21 – 2014-11-26 (×12): 17 g via ORAL
  Filled 2014-11-20 (×20): qty 1

## 2014-11-20 MED ORDER — ONDANSETRON HCL 4 MG/2ML IJ SOLN: 4.0000 mg | Freq: Four times a day (QID) | INTRAMUSCULAR | Status: DC | PRN

## 2014-11-20 MED ORDER — SODIUM CHLORIDE 0.9 % IV SOLN
3.0000 g | Freq: Four times a day (QID) | INTRAVENOUS | Status: DC
  Administered 2014-11-20 – 2014-11-29 (×33): 3 g via INTRAVENOUS
  Filled 2014-11-20 (×39): qty 3

## 2014-11-20 MED ORDER — WARFARIN SODIUM 5 MG PO TABS: 10.0000 mg | ORAL_TABLET | Freq: Once | ORAL | Status: DC

## 2014-11-20 MED ORDER — ACETAMINOPHEN 325 MG PO TABS: 650.0000 mg | ORAL_TABLET | Freq: Four times a day (QID) | ORAL | Status: DC | PRN

## 2014-11-20 MED ORDER — WARFARIN SODIUM 5 MG PO TABS: 10.0000 mg | ORAL_TABLET | Freq: Once | ORAL | Status: AC
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
## 2014-11-20 NOTE — Interval H&P Note (Signed)
Dola FactorBobby Fendrick was admitted today to Inpatient Rehabilitation with the diagnosis of pelvic fractures, degloving injury LLE.  The patient's history has been reviewed, patient examined, and there is no change in status.  Patient continues to be appropriate for intensive inpatient rehabilitation.  I have reviewed the patient's chart and labs.  Questions were answered to the patient's satisfaction. The PAPE has been reviewed and assessment remains appropriate.  Malcomb Gangemi T 11/20/2014, 9:46 PM
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
## 2014-11-20 NOTE — Progress Notes (Signed)
I met with pt and his Mom at bedside. He is in agreement to admission to inpt rehab today. I will make the arrangements. RN CM and SW are aware. [phone]
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
## 2014-11-20 NOTE — Progress Notes (Signed)
Patient called stating he is bleeding. On assessment lower abdomen incision with sutures is draining minimal to moderate amount of serosanguineous drainage. Patient however appears to be picking on site. Patient instructed not to touch the area. Site covered with gauze, abd, and tape. Will continue to monitor.

## 2014-11-20 NOTE — Care Management Note (Signed)
Case Management Note  Patient Details  Name: Lance Cline MRN: [id number] Date of Birth: 1975-09-10  Subjective/Objective:                    Action/Plan:  [facility].     Expected Discharge Date:   (unknown)               Expected Discharge Plan:  IP Rehab Facility (To be determined--will be based on weight-bearing restrictions and pt's ability to manage, amount of assist from friends/family)  In-House Referral:  Clinical Social Work  Discharge planning Services  CM Consult  Post Acute Care Choice:    Choice offered to:     DME Arranged:    DME Agency:     HH Arranged:    HH Agency:     Status of Service:  Completed, signed off  Medicare Important Message Given:  No Date Medicare IM Given:    Medicare IM give by:    Date Additional Medicare IM Given:    Additional Medicare Important Message give by:     If discussed at Long Length of Stay Meetings, dates discussed:    Additional Comments:  Quintella BatonJulie W. Kennadie Brenner, RN, BSN  Trauma/Neuro ICU Case Manager [phone]

## 2014-11-20 NOTE — Progress Notes (Signed)
Received pt. As a transfer from [address] [place].Pt. And family were oriented to unit routine.Keep monitoring pt. Closely and assessing his needs.
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
## 2014-11-20 NOTE — Progress Notes (Signed)
Patient was informed his staples need to be removed. He is very adamant and refused it initially. Patient allowed me to remove four staples from his right thigh but stated it hurts and doesn't want any more taken out till tomorrow. Patient was informed the staples have to be removed at some point and his incisions are healed up but he still insist that we wait till tomorrow to remove the rest. Will report off to oncoming day nurse about removing remainder of staples.

## 2014-11-20 NOTE — Clinical Social Work Note (Signed)
CSW received referral for SNF.  Case discussed with rehab admissions, and plan is patient will be going to [facility] inpatient.  CSW to sign off please re-consult if social work needs arise.  Ervin KnackEric R. Toshika Parrow, MSW, [employer]CSWA [phone]

## 2014-11-21 ENCOUNTER — Inpatient Hospital Stay (HOSPITAL_COMMUNITY): Payer: Self-pay | Admitting: Physical Therapy

## 2014-11-21 ENCOUNTER — Inpatient Hospital Stay (HOSPITAL_COMMUNITY): Payer: MEDICAID

## 2014-11-21 ENCOUNTER — Encounter (HOSPITAL_COMMUNITY): Payer: Self-pay | Admitting: Orthopedic Surgery

## 2014-11-21 ENCOUNTER — Inpatient Hospital Stay (HOSPITAL_COMMUNITY): Payer: Self-pay

## 2014-11-21 ENCOUNTER — Inpatient Hospital Stay (HOSPITAL_COMMUNITY): Payer: Self-pay | Admitting: Occupational Therapy

## 2014-11-21 DIAGNOSIS — M7989 Other specified soft tissue disorders: Secondary | ICD-10-CM

## 2014-11-21 LAB — COMPREHENSIVE METABOLIC PANEL
ALT: 44 U/L (ref 17–63)
ANION GAP: 8 (ref 5–15)
AST: 45 U/L — ABNORMAL HIGH (ref 15–41)
Albumin: 2 g/dL — ABNORMAL LOW (ref 3.5–5.0)
Alkaline Phosphatase: 300 U/L — ABNORMAL HIGH (ref 38–126)
CALCIUM: 8.3 mg/dL — AB (ref 8.9–10.3)
CO2: 26 mmol/L (ref 22–32)
CREATININE: 0.78 mg/dL (ref 0.61–1.24)
Chloride: 99 mmol/L — ABNORMAL LOW (ref 101–111)
GFR calc Af Amer: >60 mL/min (ref >60)
GFR calc non Af Amer: >60 mL/min (ref >60)
Glucose, Bld: 133 mg/dL — ABNORMAL HIGH (ref 65–99)
Potassium: 4.1 mmol/L (ref 3.5–5.1)
Sodium: 133 mmol/L — ABNORMAL LOW (ref 135–145)
TOTAL PROTEIN: 7.4 g/dL (ref 6.5–8.1)
Total Bilirubin: 0.3 mg/dL (ref 0.3–1.2)

## 2014-11-21 LAB — CBC WITH DIFFERENTIAL/PLATELET
Basophils Absolute: 0 10*3/uL (ref 0.0–0.1)
Basophils Relative: 0 % (ref 0–1)
EOS ABS: 0.2 10*3/uL (ref 0.0–0.7)
Eosinophils Relative: 3 % (ref 0–5)
HCT: 28.4 % — ABNORMAL LOW (ref 39.0–52.0)
Hemoglobin: 9.3 g/dL — ABNORMAL LOW (ref 13.0–17.0)
LYMPHS PCT: 25 % (ref 12–46)
Lymphs Abs: 1.8 10*3/uL (ref 0.7–4.0)
MCH: 28.7 pg (ref 26.0–34.0)
MCHC: 32.7 g/dL (ref 30.0–36.0)
MCV: 87.7 fL (ref 78.0–100.0)
MONO ABS: 0.8 10*3/uL (ref 0.1–1.0)
Monocytes Relative: 11 % (ref 3–12)
NEUTROS ABS: 4.3 10*3/uL (ref 1.7–7.7)
Neutrophils Relative %: 61 % (ref 43–77)
PLATELETS: 486 10*3/uL — AB (ref 150–400)
RBC: 3.24 MIL/uL — ABNORMAL LOW (ref 4.22–5.81)
RDW: 14.6 % (ref 11.5–15.5)
WBC: 7.1 10*3/uL (ref 4.0–10.5)

## 2014-11-21 LAB — PROTIME-INR
INR: 2.49 — ABNORMAL HIGH (ref 0.00–1.49)
PROTHROMBIN TIME: 26.6 s — AB (ref 11.6–15.2)

## 2014-11-21 MED ORDER — WARFARIN SODIUM 10 MG PO TABS
10.0000 mg | ORAL_TABLET | Freq: Once | ORAL | Status: AC
  Administered 2014-11-21: 10 mg via ORAL
  Filled 2014-11-21: qty 1

## 2014-11-21 MED ORDER — SODIUM CHLORIDE 0.9 % IJ SOLN
10.0000 mL | INTRAMUSCULAR | Status: DC | PRN
  Administered 2014-11-21 – 2014-11-29 (×10): 10 mL
  Filled 2014-11-21 (×10): qty 40

## 2014-11-21 MED ORDER — MORPHINE SULFATE ER 15 MG PO TBCR
30.0000 mg | EXTENDED_RELEASE_TABLET | Freq: Two times a day (BID) | ORAL | Status: DC
  Administered 2014-11-21 – 2014-11-26 (×10): 30 mg via ORAL
  Filled 2014-11-21 (×11): qty 2

## 2014-11-21 NOTE — Progress Notes (Signed)
Physical Therapy Session Note  Patient Details  Name: Lance Cline MRN: [id number] Date of Birth: 18-Sep-1975  Today's Date: 11/21/2014 PT Individual Time: 1530-1600 PT Individual Time Calculation (min): 30 min   Short Term Goals: Week 1:  PT Short Term Goal 1 (Week 1): Pt will be able to perform bed <-> w/c transfers with mod A PT Short Term Goal 2 (Week 1): Pt will be able to perform bed mobility with mod A on flat surface PT Short Term Goal 3 (Week 1): Pt will be able to initiate car transfer with mod A +2  Skilled Therapeutic Interventions/Progress Updates:    Therapeutic Activity: Pt received sleeping in bed with foley & IV attached, initially refusing oob after PT suggests a transfer, but with encouragement agrees to come to eob. Pt attempts to use overhead trapeze to assist in supine to sit transfer multiple times from flat bed, despite PT cues to only use bedrails and bed, and after 5 failed attempts, pt uses bedrails and achieves sitting EOB in posterior lean posture req mod A from PT at B legs slowly assisting them off eob. Once sitting up, pt req intermittent min-CGA static sit balance, PT educates pt in P/AAROM R LE LAQ x 5 reps to slowly stretch out skin graft to prevent contractures from forming req encouragement to finish this activity, then mod A at legs and verbal cues for pt to lean onto L forearm to return to supine. Pt rolls R and L in flat bed with bedrails req SBA, while PT places towels to absorb sweat where pt directs PT to do so.   Pt is anxious, pain dominant, but with education and max encouragement participates in PT.   Therapy Documentation Precautions:  Precautions Precautions: Fall Precaution Comments: Skin graft donor site on R LE.  Restrictions Weight Bearing Restrictions: Yes RLE Weight Bearing: Non weight bearing LLE Weight Bearing: Non weight bearing Pain: Pain Assessment Pain Assessment: 0-10 Pain Score: 9  Pain Type: Acute pain Pain Location:  Arm Pain Orientation: Right Pain Descriptors / Indicators: Sore;Aching Pain Onset: With Activity Pain Intervention(s): Rest Multiple Pain Sites: Yes 2nd Pain Site Pain Type: Acute pain;Surgical pain Pain Location: Pelvis Pain Orientation: Right;Left Pain Descriptors / Indicators: Aching Pain Onset: With Activity Pain Intervention(s): Rest   See FIM for current functional status  Therapy/Group: Individual Therapy  Julieann Drummonds M 11/21/2014, 3:57 PM
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
## 2014-11-21 NOTE — Progress Notes (Signed)
Patient information reviewed and entered into e[facility] system by Daltyn Degroat, RN, CRRN, PPS Coordinator.  Information including medical coding and functional independence measure will be reviewed and updated through discharge.    

## 2014-11-21 NOTE — Progress Notes (Signed)
Occupational Therapy Note  Patient Details  Name: Dola FactorBobby Parlato MRN: [id number] Date of Birth: 03/28/1976    Upon entering the room, pt supine in bed sleeping. OT arriving to make up missed minutes from previous sessions but pt refusing additional therapy secondary to pain and fatigue.    Lowella Gripittman, Saphia Vanderford L 11/21/2014, 3:22 PM
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
## 2014-11-21 NOTE — Progress Notes (Signed)
VASCULAR LAB PRELIMINARY  PRELIMINARY  PRELIMINARY  PRELIMINARY  Bilateral lower extremity venous duplex completed.    Preliminary report:  Bilateral:  No evidence of DVT, superficial thrombosis, or Baker's Cyst.   Tayten Bergdoll, RVS 11/21/2014, 12:17 PM

## 2014-11-21 NOTE — Care Management Note (Signed)
Inpatient Rehabilitation Center Individual Statement of Services  Patient Name:  Lance Cline  Date:  11/21/2014  Welcome to the Inpatient Rehabilitation Center.  Our goal is to provide you with an individualized program based on your diagnosis and situation, designed to meet your specific needs.  With this comprehensive rehabilitation program, you will be expected to participate in at least 3 hours of rehabilitation therapies Monday-Friday, with modified therapy programming on the weekends.  Your rehabilitation program will include the following services:  Physical Therapy (PT), Occupational Therapy (OT), 24 hour per day rehabilitation nursing, Therapeutic Recreaction (TR), Case Management (Social Worker), Rehabilitation Medicine, Nutrition Services and Pharmacy Services  Weekly team conferences will be held on Tuesdays to discuss your progress.  Your Social Worker will talk with you frequently to get your input and to update you on team discussions.  Team conferences with you and your family in attendance may also be held.  Expected length of stay: 12-14 days  Overall anticipated outcome: supervision/ min assist @ wheelchair  Depending on your progress and recovery, your program may change. Your Social Worker will coordinate services and will keep you informed of any changes. Your Social Worker's name and contact numbers are listed  below.  The following services may also be recommended but are not provided by the Inpatient Rehabilitation Center:   Driving Evaluations  Home Health Rehabiltiation Services  Outpatient Rehabilitation Services  Vocational Rehabilitation   Arrangements will be made to provide these services after discharge if needed.  Arrangements include referral to agencies that provide these services.  Your insurance has been verified to be:  None (SW will assist with Medicaid application) Your primary doctor is:  None (SW will assist with establishing a primary care  MD)  Pertinent information will be shared with your doctor and your insurance company.  Social Worker:  HighlandLucy Maicee Ullman, [place] [phone] or [phone]) [phone]   Information discussed with and copy given to patient by: Amada JupiterHOYLE, [name], 11/21/2014, 2:41 PM
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
## 2014-11-21 NOTE — Progress Notes (Signed)
Occupational Therapy Note  Patient Details  Name: Dola FactorBobby Eifler MRN: [id number] Date of Birth: 1976-03-24  Today's Date: 11/21/2014 OT Individual Time: 1130-1145 OT Individual Time Calculation (min): 15 min   Pt in the room with nursing and is feeling anxious about transfer from w/c to bed. Continue to provide education about slide board transfer method and setup of w/c, and management of LEs while maintaining precautions. Pt performed slide board transfer with min A towards pt's left (on bed pad for skin protection) with +2 foir safety. Pt required A for both LEs into the bed to return to supine. Pt continued to be anxious but appreciated explanation of transfer method and extra time.   Also discussed and demonstrated with nursing and pt safe method of using maxi move until pt feels more confident with slide board. Nursing to still use maxi move at this date.   Roney MansSmith, Isatu Macinnes [facility] 11/21/2014, 3:52 PM
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
## 2014-11-22 ENCOUNTER — Inpatient Hospital Stay (HOSPITAL_COMMUNITY): Payer: MEDICAID

## 2014-11-22 ENCOUNTER — Encounter (HOSPITAL_COMMUNITY): Payer: Self-pay | Admitting: Orthopedic Surgery

## 2014-11-22 ENCOUNTER — Inpatient Hospital Stay (HOSPITAL_COMMUNITY): Payer: Self-pay | Admitting: Occupational Therapy

## 2014-11-22 ENCOUNTER — Inpatient Hospital Stay (HOSPITAL_COMMUNITY): Payer: MEDICAID | Admitting: Occupational Therapy

## 2014-11-22 LAB — PROTIME-INR
INR: 2.78 — AB (ref 0.00–1.49)
PROTHROMBIN TIME: 28.9 s — AB (ref 11.6–15.2)

## 2014-11-22 MED ORDER — SORBITOL 70 % SOLN
60.0000 mL | Status: AC
  Administered 2014-11-22: 60 mL via ORAL
  Filled 2014-11-22: qty 60

## 2014-11-22 MED ORDER — SENNOSIDES-DOCUSATE SODIUM 8.6-50 MG PO TABS
2.0000 | ORAL_TABLET | Freq: Every day | ORAL | Status: DC
  Administered 2014-11-22 – 2014-11-26 (×3): 2 via ORAL
  Filled 2014-11-22 (×8): qty 2

## 2014-11-22 MED ORDER — MAGIC MOUTHWASH
2.0000 mL | Freq: Four times a day (QID) | ORAL | Status: DC | PRN
  Filled 2014-11-22: qty 5

## 2014-11-22 MED ORDER — WARFARIN SODIUM 10 MG PO TABS
10.0000 mg | ORAL_TABLET | Freq: Every day | ORAL | Status: DC
  Administered 2014-11-22: 10 mg via ORAL
  Filled 2014-11-22 (×2): qty 1

## 2014-11-22 NOTE — Progress Notes (Signed)
Physical Therapy Session Note  Patient Details  Name: Lance Cline MRN: [id number] Date of Birth: 1976-03-11  Today's Date: 11/22/2014 Lance Cline Individual Time: 1000-1100 Lance Cline Individual Time Calculation (min): 60 min   Short Term Goals: Week 1:  Lance Cline Short Term Goal 1 (Week 1): Lance Cline will be able to perform bed <-> w/c transfers with mod A Lance Cline Short Term Goal 2 (Week 1): Lance Cline will be able to perform bed mobility with mod A on flat surface Lance Cline Short Term Goal 3 (Week 1): Lance Cline will be able to initiate car transfer with mod A +2  Skilled Therapeutic Interventions/Progress Updates:   Session focused on functional w/c propulsion, transfer training with emphasis on set-up of w/c parts management and technique, and education on Lance Cline POC and goals of rehab stay. Lance Cline participates with max encouragement.  Practiced set-up and parts management for transfer x 2 reps with max verbal cues. Lance Cline required min A for transfer with slideboard back to bed with +2 for safety with max verbal cues for technique and set-up of parts management. Focused on Lance Cline directing care to prepare for caregiver at home to assist with patient. +2 assist needed to return to supine. Lance Cline in supine performed gentle AAROM and strengthening to BLE for knee/hip flexion, ankle pumps, and quad sets x 5-10 reps each. Left in supine with all needs in reach and family at bedside.   Therapy Documentation Precautions:  Precautions Precautions: Fall Precaution Comments: Skin graft donor site on R LE.  Restrictions Weight Bearing Restrictions: Yes RLE Weight Bearing: Non weight bearing LLE Weight Bearing: Non weight bearing   Pain: C/o pain and RN notified for pain medication during session. 8/10 pain all over in arms and legs.   See FIM for current functional status  Therapy/Group: Individual Therapy  Karolee StampsGray, Lance Cline, Lance Cline, Lance Cline  11/22/2014, 12:17 PM

## 2014-11-22 NOTE — Progress Notes (Signed)
Occupational Therapy Session Note  Patient Details  Name: Lance Cline MRN: [id number] Date of Birth: 12-Mar-1976  Today's Date: 11/22/2014 OT Individual Time:  -   1300-1400  (60 min)       Short Term Goals: Week 1:  OT Short Term Goal 1 (Week 1): [facility] with min A OT Short Term Goal 2 (Week 1): Pt will complete LB dressing with min A OT Short Term Goal 3 (Week 1): Pt will complete toileting task with min A OT Short Term Goal 4 (Week 1): Pt will demonstrate carry over of education regarding compensatory stratiegies taught in OT session with min VCs Week 2:     Skilled Therapeutic Interventions/Progress Updates:    Pt. Lying in bed upon OT arrival.   Provided educational cues for pt to go from supine to sit without use trapeze bar.  Pt performed with minimal assist using rails.  Pt. Transferred from EOB to wc with sliding board and min assist.  Reitterated maintaining NWB status. On both LE when transferring.  Assisted with moving Left LE so as to prevent him WB'ing through it.  Pt. Propelled wc to gym.  Performed UE there ex using 6 # weights.  Pt. Propelled wc back to room and left with all needs in reach.    Therapy Documentation Precautions:  Precautions Precautions: Fall Precaution Comments: Skin graft donor site on R LE.  Restrictions Weight Bearing Restrictions: Yes RLE Weight Bearing: Non weight bearing LLE Weight Bearing: Non weight bearing      Pain:  6/10  thighs             See FIM for current functional status  Therapy/Group: Individual Therapy  Humberto Sealsdwards, Georgia Delsignore J 11/22/2014, 8:23 AM
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
## 2014-11-22 NOTE — Progress Notes (Signed)
Physical Therapy Session Note  Patient Details  Name: Lance Cline MRN: [id number] Date of Birth: December 14, 1975  Today's Date: 11/22/2014 PT Individual Time: 1500-1530 PT Individual Time Calculation (min): 30 min   Short Term Goals: Week 1:  PT Short Term Goal 1 (Week 1): Pt will be able to perform bed <-> w/c transfers with mod A PT Short Term Goal 2 (Week 1): Pt will be able to perform bed mobility with mod A on flat surface PT Short Term Goal 3 (Week 1): Pt will be able to initiate car transfer with mod A +2  Skilled Therapeutic Interventions/Progress Updates:    Session focused on w/c mobility through obstacle course and picking up cones off floor (R and L UE) for sitting balance functional w/c mobility training and transfer training back to bed using slideboard and emphasis on pt directing caregiver with mod verbal cues for recall of sequence and technique. Pt able to perform transfer with min A of 1 person and +2 for sit to supine due to pt's increasing pain level (1 person to A with management of BLE and second person to assist with trunk). Pt continues to be limited by pain, poor activity tolerance, and anxiety with mobility. Left in supine with all needs in reach and RN made aware pt requesting pain medication.   Therapy Documentation Precautions:  Precautions Precautions: Fall Precaution Comments: Skin graft donor site on R LE.  Restrictions Weight Bearing Restrictions: Yes RLE Weight Bearing: Non weight bearing LLE Weight Bearing: Non weight bearing Pain:  7/10 pain all over - RN made aware.  See FIM for current functional status  Therapy/Group: Individual Therapy  Karolee StampsGray, Yomayra Tate [facility]     11/22/2014, 4:13 PM

## 2014-11-22 NOTE — Progress Notes (Signed)
Occupational Therapy Session Note  Patient Details  Name: Lance Cline MRN: [id number] Date of Birth: Dec 22, 1975  Today's Date: 11/22/2014 OT Individual Time: 7829-56210830-0930 OT Individual Time Calculation (min): 60 min    Short Term Goals: Week 1:  OT Short Term Goal 1 (Week 1): [facility] A OT Short Term Goal 2 (Week 1): Pt will complete LB dressing with min A OT Short Term Goal 3 (Week 1): Pt will complete toileting task with min A OT Short Term Goal 4 (Week 1): Pt will demonstrate carry over of education regarding compensatory stratiegies taught in OT session with min VCs  Skilled Therapeutic Interventions/Progress Updates:    Pt seen for OT ADL re-training with emphasis on functional transfers, functional activity tolerance, and education of modified LB dressing techniques. Pt in supine upon arrival, agreeable to tx and denying plain. Pt declined bathing task, stating his girlfriend will assist with bathing at a later time. Education provided regarding modified LB dressing techniques with introduction of reacher. Pt educated regarding technique to dress in supine or seated EOB, pt decided to dress in supine. Pt required assist to thread B LEs into pants, however, able to pull pants up to waist with use of reacher. Min A required to lift L LE for clothing management. Pt educated regarding log rolling techniques to pull pants up. Pt inititally bearing weight through B heels to complete bed mobility. Education provided regarding NWB status, pt voiced being unaware that pressing weight into feet counted as weight bearing, education provided and pt voiced understanding. Pt completed bed mobility with min A and max VCs for technique/ sequencing. Pt transferred supine> EOB with mod A for LE management, however, required max cuing and encouragement for successful technique, as pt attempted to use bed mobilty and trying it "his way". Pt required B UE support when sitting EOB as pt  fearful of falling and felt as if he didn't have his balance. Pt very anxious with all mobility. He completed sliding board transfer to w/c with +2 assist to stabilize equipment, place sliding board and assist to manage B LEs. Pt completed w/c mobility throughout unit with assist to manage IV pole and VCs for w/c propulsion techniques. Pt self propelled throughout unit, over carpeted surfaces, and through tight environment to simulate home w/c mobility. Pt required 1 rest break during functional w/c task. Pt returned to room at end of session, left with all needs in reach.    Pt denied pain initially, however, has increase discomfort with mobility. RN made aware of desire for pain meds prior to PT session.  Pt educated regarding role of OT, POC, need for assist, reducing caregiver burden, modified dressing techniques, use of reacher, w/c management, and d/c planning.   Therapy Documentation Precautions:  Precautions Precautions: Fall Precaution Comments: Skin graft donor site on R LE.  Restrictions Weight Bearing Restrictions: Yes RLE Weight Bearing: Non weight bearing LLE Weight Bearing: Non weight bearing Pain: Pain Assessment Pain Assessment: No/denies pain  See FIM for current functional status  Therapy/Group: Individual Therapy  [name], Vandell Kun C 11/22/2014, 7:15 AM
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
## 2014-11-23 ENCOUNTER — Inpatient Hospital Stay (HOSPITAL_COMMUNITY): Payer: MEDICAID

## 2014-11-23 DIAGNOSIS — S32810A Multiple fractures of pelvis with stable disruption of pelvic ring, initial encounter for closed fracture: Secondary | ICD-10-CM

## 2014-11-23 LAB — PROTIME-INR
INR: 3.2 — ABNORMAL HIGH (ref 0.00–1.49)
Prothrombin Time: 32.1 seconds — ABNORMAL HIGH (ref 11.6–15.2)

## 2014-11-23 MED ORDER — DIPHENHYDRAMINE HCL 25 MG PO CAPS
25.0000 mg | ORAL_CAPSULE | ORAL | Status: DC | PRN
  Administered 2014-11-23: 25 mg via ORAL
  Filled 2014-11-23: qty 1

## 2014-11-23 MED ORDER — WARFARIN SODIUM 5 MG PO TABS
5.0000 mg | ORAL_TABLET | Freq: Once | ORAL | Status: AC
  Administered 2014-11-23: 5 mg via ORAL
  Filled 2014-11-23: qty 1

## 2014-11-23 NOTE — Progress Notes (Signed)
Physical Therapy Session Note  Patient Details  Name: Lance Cline MRN: [id number] Date of Birth: [birth year]  Today's Date: 11/23/2014 PT Individual Time: 0900-1000 PT Individual Time Calculation (min): 60 min   Short Term Goals: Week 1:  PT Short Term Goal 1 (Week 1): Pt will be able to perform bed <-> w/c transfers with mod A PT Short Term Goal 2 (Week 1): Pt will be able to perform bed mobility with mod A on flat surface PT Short Term Goal 3 (Week 1): Pt will be able to initiate car transfer with mod A +2  Skilled Therapeutic Interventions/Progress Updates:   Session focused on functional bed mobility (flat surface without trapeze but still requires use of bed rail) for rolling and supine to sit (mod A to manage BLE), functional slideboard transfer with overall min A to help manage placement of LLE especially and cues for sequence and hand placement, w/c propulsion for functional mobility and overall strength and endurance on unit, and seated LE therex for ROM and strengthening. Seated in w/c performed AAROM for LAQ, seated marches, and isometric hip abduction and adduction x 10 reps x 2 sets each for BLE. Family observed session. Left in family room at end of session and notified RN.  Therapy Documentation Precautions:  Precautions Precautions: Fall Precaution Comments: Skin graft donor site on R LE.  Restrictions Weight Bearing Restrictions: Yes RLE Weight Bearing: Non weight bearing LLE Weight Bearing: Non weight bearing   Pain: Pain Assessment Pain Assessment: 0-10 Pain Score: 8  Pain Type: Surgical pain Pain Location: Leg Pain Orientation: Left Pain Descriptors / Indicators: Aching Pain Intervention(s): Medication (See eMAR)premedicated for pain.  See FIM for current functional status  Therapy/Group: Individual Therapy  Karolee StampsGray, Mykelti Goldenstein Darrol PokeBrescia   Anissia Wessells B. Daaiyah Baumert, PT, DPT   11/23/2014, 10:06 AM
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
## 2014-11-24 ENCOUNTER — Inpatient Hospital Stay (HOSPITAL_COMMUNITY): Payer: MEDICAID | Admitting: Occupational Therapy

## 2014-11-24 ENCOUNTER — Inpatient Hospital Stay (HOSPITAL_COMMUNITY): Payer: MEDICAID | Admitting: Physical Therapy

## 2014-11-24 DIAGNOSIS — S32810D Multiple fractures of pelvis with stable disruption of pelvic ring, subsequent encounter for fracture with routine healing: Principal | ICD-10-CM

## 2014-11-24 LAB — PROTIME-INR
INR: 3.46 — ABNORMAL HIGH (ref 0.00–1.49)
PROTHROMBIN TIME: 34.1 s — AB (ref 11.6–15.2)

## 2014-11-24 MED ORDER — WARFARIN SODIUM 1 MG PO TABS
1.0000 mg | ORAL_TABLET | Freq: Once | ORAL | Status: AC
  Administered 2014-11-24: 1 mg via ORAL
  Filled 2014-11-24: qty 1

## 2014-11-24 NOTE — Progress Notes (Signed)
Occupational Therapy Session Note  Patient Details  Name: Lance FactorBobby Brookens MRN: [id number] Date of Birth: 1975-07-22  Today's Date: 11/24/2014 OT Individual Time:  -   0800-0900      (60 min)  1st session                                          1300-1345   (45 min)  2nd session   Short Term Goals: Week 1:  OT Short Term Goal 1 (Week 1): [facility] Short Term Goal 2 (Week 1): Pt will complete LB dressing with min A OT Short Term Goal 3 (Week 1): Pt will complete toileting task with min A OT Short Term Goal 4 (Week 1): Pt will demonstrate carry over of education regarding compensatory stratiegies taught in OT session with min VCs  Skilled Therapeutic Interventions/Progress Updates:         1st session:  Session focused on functional bed mobility (flat surface without trapeze but  rolling and supine to sit (mod A to manage BLE),  transfer with overall min A to help manage placement of LLE especially and cues for sequence and hand placement, w/c propulsion for functional mobility and overall strength and endurance on unit, Pt. Transferred from 3n 1 BSC w/o SB with min assist and assistance with BLE management.  Pt. taken to gym and left with P.T  2nd session:  Pt. Sitting in wc.  He wanted the dressing changed on his left leg and wanted to get back in bed, but then changed his mind.  Agreed to do BUE exercises in wc using 6 # weights.  Performed sho.press x15, curls x15, biceps/tripeps x15, punching exercise x15.  Pt. Propelled wc to family room at end of session with no assist. OT assisted with IV pole after pt tried to push it himself for about 50 feet.  Left pt in family room with his relatives.     Therapy Documentation Precautions:  Precautions Precautions: Fall Precaution Comments: Skin graft donor site on R LE.  Restrictions Weight Bearing Restrictions: Yes RLE Weight Bearing: Non weight bearing LLE Weight Bearing: Non weight bearing   Pain:   5  /10  pelvic pain      RLE more than LLE  (both sessions)       See FIM for current functional status  Therapy/Group: Individual Therapy  Humberto Sealsdwards, Lynette Topete J 11/24/2014, 7:51 AM

## 2014-11-24 NOTE — Progress Notes (Signed)
Physical Therapy Session Note  Patient Details  Name: Lance Cline MRN: [id number] Date of Birth: 1975-11-24  Today's Date: 11/24/2014 PT Individual Time: 0900-1000 PT Individual Time Calculation (min): 60 min   Short Term Goals: Week 1:  PT Short Term Goal 1 (Week 1): Pt will be able to perform bed <-> w/c transfers with mod A PT Short Term Goal 2 (Week 1): Pt will be able to perform bed mobility with mod A on flat surface PT Short Term Goal 3 (Week 1): Pt will be able to initiate car transfer with mod A +2  Skilled Therapeutic Interventions/Progress Updates:  Pt was seen bedside in the rehab gym. Allowed pt to lead therapist in performing transfers. Pt able to position w/c for transfer with S and verbal cues. Pt performed a pop over transfer with min A and verbal cues, able to maintain WB status throughout transfer. Pt transferred edge of mat to supine with mod A and verbal cues. Utilized a wedge for comfort while on mat. Pt performed 3 sets x 10 reps each quad sets B LEs, performed 3 sets x 10 reps each SAQs R LE, attempted LE however increased pain L LE and pt unable to continue. Pt requesting nurse to give pt something for pain now. Pt's nurse in gym to give pt something for pain. Pt allowed to rest, then willing to attempt to get back into w/c. Pt transferred supine to edge of mat with assist x 2 secondary to c/o pain. Pt transferred edge of mat to w/c pop over transfer with min A x 1 with verbal cues. Pt returned to room and left sitting up in w/c with call bell within reach.   Therapy Documentation Precautions:  Precautions Precautions: Fall Precaution Comments: Skin graft donor site on R LE.  Restrictions Weight Bearing Restrictions: Yes RLE Weight Bearing: Non weight bearing LLE Weight Bearing: Non weight bearing General:   Vital Signs:   Pain: Pt c/o 5 to 6/10 pain L worse than R LE at start of therapy.   See FIM for current functional status  Therapy/Group: Individual  Therapy  Rayford HalstedMitchell, Mitsuko Luera G 11/24/2014, 12:32 PM

## 2014-11-24 NOTE — Progress Notes (Signed)
Physical Therapy Session Note  Patient Details  Name: Lance Cline MRN: [id number] Date of Birth: June 02, 1976  Today's Date: 11/24/2014 PT Individual Time: 1430-1500 PT Individual Time Calculation (min): 30 min   Short Term Goals: Week 1:  PT Short Term Goal 1 (Week 1): Pt will be able to perform bed <-> w/c transfers with mod A PT Short Term Goal 2 (Week 1): Pt will be able to perform bed mobility with mod A on flat surface PT Short Term Goal 3 (Week 1): Pt will be able to initiate car transfer with mod A +2  Skilled Therapeutic Interventions/Progress Updates:  Pt was seen inhallway in the pm. Pt performed knee flex/ext gentle AAROM B LEs,  3 sets x 10 reps each. Pt propelled w/c about 75 feet with B UEs and S. Pt transferred w/c to edge of bed with min A and verbal cues with sliding board. Pt transferred edge of bed to supine with mod A and verbal cues. Pt left sitting up in bed with call bell within reach.   Therapy Documentation Precautions:  Precautions Precautions: Fall Precaution Comments: Skin graft donor site on R LE.  Restrictions Weight Bearing Restrictions: Yes RLE Weight Bearing: Non weight bearing LLE Weight Bearing: Non weight bearing General:   Pain: Pt c/o 6/10 pain B LEs.   See FIM for current functional status  Therapy/Group: Individual Therapy  Lance Cline, Lance Cline 11/24/2014, 3:00 PM
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
## 2014-11-25 ENCOUNTER — Inpatient Hospital Stay (HOSPITAL_COMMUNITY): Payer: MEDICAID

## 2014-11-25 ENCOUNTER — Inpatient Hospital Stay (HOSPITAL_COMMUNITY): Payer: Self-pay | Admitting: Occupational Therapy

## 2014-11-25 LAB — PROTIME-INR
INR: 2.99 — ABNORMAL HIGH (ref 0.00–1.49)
PROTHROMBIN TIME: 30.5 s — AB (ref 11.6–15.2)

## 2014-11-25 MED ORDER — WARFARIN SODIUM 2.5 MG PO TABS
2.5000 mg | ORAL_TABLET | Freq: Once | ORAL | Status: AC
  Administered 2014-11-25: 2.5 mg via ORAL
  Filled 2014-11-25: qty 1

## 2014-11-25 NOTE — Progress Notes (Signed)
Physical Therapy Session Note  Patient Details  Name: Lance Cline MRN: [id number] Date of Birth: 1976/02/11  Today's Date: 11/25/2014 PT Individual Time: 1500-1530 PT Individual Time Calculation (min): 30 min   Short Term Goals: Week 1:  PT Short Term Goal 1 (Week 1): Pt will be able to perform bed <-> w/c transfers with mod A PT Short Term Goal 2 (Week 1): Pt will be able to perform bed mobility with mod A on flat surface PT Short Term Goal 3 (Week 1): Pt will be able to initiate car transfer with mod A +2  Skilled Therapeutic Interventions/Progress Updates:   Session focused on family education with pt's nephew who will be providing assistance at discharge for transfers and mobility with demonstration and return demonstration of simulated car transfer. Pt performed transfer in and out of car with overall min A to manage LLE and assist with w/c parts management. Pt able to direct transfer with nephew providing physical A with extra time. Pt's nephew reports being familiar with breakdown of w/c due to having to assist his aunt in the past and able to demonstrate correct use of elevating legrests. Discussed with primary OT, CSW, and PA in regards to possible d/c on Wednesday if cleared medically. Equipment recommendations given to CSW for w/c and slideboard.   Therapy Documentation Precautions:  Precautions Precautions: Fall Precaution Comments: Skin graft donor site on R LE.  Restrictions Weight Bearing Restrictions: Yes RLE Weight Bearing: Non weight bearing LLE Weight Bearing: Non weight bearing   Pain: Premedicated. Able to tolerate session without complaints.   See FIM for current functional status  Therapy/Group: Individual Therapy  Karolee StampsGray, Meaghen Vecchiarelli Darrol PokeBrescia  Valene Villa B. Keiden Deskin, PT, DPT  11/25/2014, 3:47 PM

## 2014-11-25 NOTE — Progress Notes (Signed)
Occupational Therapy Session Note  Patient Details  Name: Lance Cline MRN: [id number] Date of Birth: August 09, 1975  Today's Date: 11/25/2014 OT Individual Time: 1330-1430 OT Individual Time Calculation (min): 60 min    Short Term Goals: Week 1:  OT Short Term Goal 1 (Week 1): [facility] Short Term Goal 2 (Week 1): Pt will complete LB dressing with min A OT Short Term Goal 3 (Week 1): Pt will complete toileting task with min A OT Short Term Goal 4 (Week 1): Pt will demonstrate carry over of education regarding compensatory stratiegies taught in OT session with min VCs  Skilled Therapeutic Interventions/Progress Updates:    Pt seen for OT session focusing on functional transfers and UE strengthening/ ROM. Pt in supine upon arrival, agreeable to tx. Pt transferred to EOB with assist to manage L LE and completed scooting transfer with assist for management of L LE. Pt self propelled w/c to therapy gym where he completed transfer to padded tub bench with toilet seat cut out. Pt completed transfer with min A via scoot. Pt educated extensively regarding DME for d/c use and pros and cons of padded tub bench with cut out vs. Standard drop arm BSC. Pt voiced feeling that his w/c will fit in the bathroom at his mother's house which is his d/c disposition. Pt deciding to get drop arm BSC at d/c, CSW made aware. Pt returned to w/c and declined all other functional transfers due to fatigue. Pt completed UE strengthening exercises with high level (blue) theraband.   Exercises completed at all major muscle groups x10 reps on B sides. Pt returned to room at end of session, left with all needs in reach and pt's nephew present upon arrival back to room.  Therapy Documentation Precautions:  Precautions Precautions: Fall Precaution Comments: Skin graft donor site on R LE.  Restrictions Weight Bearing Restrictions: Yes RLE Weight Bearing: Non weight bearing LLE Weight Bearing:  Non weight bearing Pain: Pain Assessment Pain Score: 3  Pain Type: Surgical pain Pain Location: Leg Pain Orientation: Left Pain Descriptors / Indicators: Aching Pain Intervention(s): Repositioned;Ambulation/increased activity  See FIM for current functional status  Therapy/Group: Individual Therapy  [name], [name] 11/25/2014, 12:42 PM

## 2014-11-25 NOTE — Progress Notes (Signed)
Occupational Therapy Session Note  Patient Details  Name: Lance Cline MRN: [id number] Date of Birth: Aug 04, 1975  Today's Date: 11/25/2014 OT Individual Time: [id number] OT Individual Time Calculation (min): 60 min    Short Term Goals: Week 1:  OT Short Term Goal 1 (Week 1): [name] Short Term Goal 2 (Week 1): Pt will complete LB dressing with min A OT Short Term Goal 3 (Week 1): Pt will complete toileting task with min A OT Short Term Goal 4 (Week 1): Pt will demonstrate carry over of education regarding compensatory stratiegies taught in OT session with min VCs  Skilled Therapeutic Interventions/Progress Updates:    Pt seen for OT therapy focusing on functional transfers, unsupported sitting tolerance/ balance, and education. Pt in supine upon arrival, agreeable to tx. Pt declined bathing/dressing stating that he did it with nursing last night. Pt transferred supine> EOB with mod A for management of B LEs and max VCs for technique/ sequencing. Pt declined use of sliding board for transfer to w/Cline. Pt transferred via scooting/ UE push up to w/Cline with min A and management of LEs as pt with decreased ability to move L LE due to pain and weakness. Pt self propelled w/Cline to therapy gym where he transferred onto mat in same manner. Pt requires B UE support to sit on mat to eliviate pressure on B thighs due to pain on skin grafting sites. Pt completed LE strengthening/ ROM exercises seat on mat. Pt completed R LE  leg extension, knee lifts, and ab/adduction sliding foot on towel to increase LE strength for increased independence with management of B LEs during functional transfers. Pt unable to tolerate exercises on L LE due to pain. Pt reports 5/10 pain at rest and 7/10 pain with exercise.   Emphasis during transfers on directing care, set-up of w/Cline for transfers, and w/Cline management parts. Working to increase pt's independence with transfers and w/Cline management.   Extensive education provided regarding OT goals, POC, CIR, increasing independence with ADLs, reducing caregiver burden, DME, and d/Cline planning. Pt with lots of questions regarding process and possible d/Cline date, all questions answered regarding team process of determining d/Cline date at upcoming team conference meeting.   Therapy Documentation Precautions:  Precautions Precautions: Fall Precaution Comments: Skin graft donor site on R LE.  Restrictions Weight Bearing Restrictions: Yes RLE Weight Bearing: Non weight bearing LLE Weight Bearing: Non weight bearing Pain: Pain Assessment Pain Score: 5  Pain Type: Acute pain;Surgical pain Pain Location: Leg Pain Orientation: Left Pain Descriptors / Indicators: Aching;Sore Pain Intervention(s): Repositioned;Ambulation/increased activity  See FIM for current functional status  Therapy/Group: Individual Therapy  Lance Cline 11/25/2014, 7:19 AM

## 2014-11-25 NOTE — Progress Notes (Signed)
[facility]   Pt not in room  Will perform dressing change tomorrow am  Mearl LatinKeith W. Adrionna Delcid, PA-C [facility] [phone] (P) 11/25/2014 2:15 PM

## 2014-11-25 NOTE — Progress Notes (Signed)
Physical Therapy Session Note  Patient Details  Name: Lance Cline MRN: [id number] Date of Birth: [birth year]  Today's Date: 11/25/2014 PT Individual Time: 1000-1100 PT Individual Time Calculation (min): 60 min   Short Term Goals: Week 1:  PT Short Term Goal 1 (Week 1): Pt will be able to perform bed <-> w/c transfers with mod A PT Short Term Goal 2 (Week 1): Pt will be able to perform bed mobility with mod A on flat surface PT Short Term Goal 3 (Week 1): Pt will be able to initiate car transfer with mod A +2  Skilled Therapeutic Interventions/Progress Updates:   Session focused on functional transfer to simulated car to prepare for d/c, functional w/c mobility and parts management for mobility training and overall strength and endurance S on unit, and seated LE therex (AROM on R and AAROM on LLE for LAQ and hip marches). Pt required min A for car transfer with extra time and cues for w/c parts management and LLE management (used slideboard going in and no slideboard going out). Educated on technique to either use passenger seat (with recommendation to slide seat as far back as possible to allow for LE room) or to sit in long sitting in back seat. Recommended to train family on technique this afternoon if present. Pt is making excellent progress and will discuss with team but likely ready to d/c from PT standpoint by end of this week. Pt eager for d/c and feels like he is making good progress as well.   LTG for bed mobility downgraded to min A for LE management.  Therapy Documentation Precautions:  Precautions Precautions: Fall Precaution Comments: Skin graft donor site on R LE.  Restrictions Weight Bearing Restrictions: Yes RLE Weight Bearing: Non weight bearing LLE Weight Bearing: Non weight bearing   Pain: 6/10 pain in BLE and back - premedicated.  See FIM for current functional status  Therapy/Group: Individual Therapy  Karolee StampsGray, Nancy Manuele Darrol PokeBrescia  Worthington Cruzan B. Josslin Sanjuan, PT, DPT  11/25/2014,  12:21 PM
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
## 2014-11-25 NOTE — Progress Notes (Signed)
RN noted that dry dressing on recipient site of skin graft was slipping during therapy. RN applied new 4x4 gauze and new abd pads placed. Mepitel still in place.
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
## 2014-11-26 ENCOUNTER — Inpatient Hospital Stay (HOSPITAL_COMMUNITY): Payer: MEDICAID

## 2014-11-26 ENCOUNTER — Inpatient Hospital Stay (HOSPITAL_COMMUNITY): Payer: Self-pay | Admitting: Occupational Therapy

## 2014-11-26 DIAGNOSIS — S32402S Unspecified fracture of left acetabulum, sequela: Secondary | ICD-10-CM

## 2014-11-26 DIAGNOSIS — S7291XD Unspecified fracture of right femur, subsequent encounter for closed fracture with routine healing: Secondary | ICD-10-CM

## 2014-11-26 LAB — PROTIME-INR
INR: 2.31 — AB (ref 0.00–1.49)
Prothrombin Time: 25.1 seconds — ABNORMAL HIGH (ref 11.6–15.2)

## 2014-11-26 MED ORDER — METHOCARBAMOL 500 MG PO TABS
1000.0000 mg | ORAL_TABLET | Freq: Four times a day (QID) | ORAL | Status: DC | PRN
  Administered 2014-11-26 – 2014-11-28 (×4): 1000 mg via ORAL
  Filled 2014-11-26 (×4): qty 2

## 2014-11-26 MED ORDER — MORPHINE SULFATE 15 MG PO TABS
30.0000 mg | ORAL_TABLET | ORAL | Status: DC | PRN
  Administered 2014-11-26 – 2014-11-27 (×5): 30 mg via ORAL
  Filled 2014-11-26 (×7): qty 2

## 2014-11-26 MED ORDER — WARFARIN SODIUM 7.5 MG PO TABS
7.5000 mg | ORAL_TABLET | Freq: Once | ORAL | Status: AC
  Administered 2014-11-26: 7.5 mg via ORAL
  Filled 2014-11-26 (×2): qty 1

## 2014-11-26 NOTE — Progress Notes (Signed)
Occupational Therapy Session Note  Patient Details  Name: Lance FactorBobby Marsicano MRN: [id number] Date of Birth: June 16, 1976  Today's Date: 11/26/2014 OT Individual Time: 1400-1420 OT Individual Time Calculation (min): 20 min    Short Term Goals: Week 1:  OT Short Term Goal 1 (Week 1): [facility] A OT Short Term Goal 2 (Week 1): Pt will complete LB dressing with min A OT Short Term Goal 3 (Week 1): Pt will complete toileting task with min A OT Short Term Goal 4 (Week 1): Pt will demonstrate carry over of education regarding compensatory stratiegies taught in OT session with min VCs  Skilled Therapeutic Interventions/Progress Updates:    1:1 OT with focus on demonstration of transfer techniques.  Upon arrival pt reports having been up in w/c all day and getting stiff, requesting to return to bed.  Encouraged pt to participate in activity prior to returning to bed with pt declining. Pt verbalized NWB status and demonstrated carryover of education with transfer.  Pt able to direct his care with requests to therapist for setup of environment and then propelling w/c and setting up next to bed in preparation for transfer.  Pt's nephew present and assisted pt with transfer with positioning of legs in prep for transfer and then positioning legs up in to bed.  Educated on positioning of w/c prior to transfer to allow better positioning for when in supine with pt verbalizing understanding.  Pt utilized sheet to position LLE in more optimal position.  Pt refused further therapy even at bed level.    Therapy Documentation Precautions:  Precautions Precautions: Fall Precaution Comments: Skin graft donor site on R LE.  Restrictions Weight Bearing Restrictions: Yes RLE Weight Bearing: Non weight bearing LLE Weight Bearing: Non weight bearing General: General OT Amount of Missed Time: 25 Minutes  Vital Signs: Therapy Vitals Temp: 98.7 F (37.1 C) Temp Source: Oral Pulse Rate:  95 Resp: 16 BP: 113/78 mmHg Patient Position (if appropriate): Sitting Oxygen Therapy SpO2: 100 % O2 Device: Not Delivered Pain:  Pt with no c/o pain  See FIM for current functional status  Therapy/Group: Individual Therapy  Rosalio LoudHOXIE, Corneluis Allston 11/26/2014, 3:31 PM

## 2014-11-26 NOTE — Plan of Care (Signed)
Problem: RH BOWEL ELIMINATION Goal: RH STG MANAGE BOWEL WITH ASSISTANCE STG Manage Bowel with Mod Assistance.  Outcome: Not Progressing Pt Last bowel movement 11/23/14. Refusing laxative
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
## 2014-11-26 NOTE — Progress Notes (Signed)
Physical Therapy Discharge Summary  Patient Details  Name: Lance Cline MRN: [id number] Date of Birth: 06-20-1976  Time: 1100-1109 (9 min)  Individual session. Denies pain. Pt missed 51 min of scheduled session due to pt refusal as he states he has already gone through everything yesterday with therapy and just awaiting word from ortho about wound vac and discharge. Per notes, this therapist in agreement that pt is at goal level of min A w/c level. Pt and family present and deny any concerns or questions in regards to d/c or HEP. Pt request written HEP sheet, so this therapist wrote and issued with exercises we have been doing for BLE ROM/strengthening. Pt transferring OOB with fiance and nephew present. Will d/c from PT services at this time.    Patient has met 7 of 7 long term goals due to improved activity tolerance, improved balance, increased strength, increased range of motion, decreased pain and ability to compensate for deficits.  Patient to discharge at a wheelchair level [place] due to LE management needed during bed mobility and transfers.  Patient's care partner is independent to provide the necessary physical assistance at discharge.  Reasons goals not met: n/a - all goals met at this time.  Recommendation:  Patient will benefit from ongoing skilled PT services in [place] initially and progress to OPPT once WB status upgraded to continue to advance safe functional mobility, address ongoing impairments in strength, ROM, endurance, transfers, WB status and minimize fall risk.  Equipment: 18x18 w/c with elevating legrests, basic cushion, and 30" slideboard  Reasons for discharge: treatment goals met and discharge from hospital  Patient/family agrees with progress made and goals achieved: Yes  PT Discharge Precautions/Restrictions Precautions Precaution Comments: Skin graft donor site on R LE.  Restrictions Weight Bearing Restrictions: Yes RLE Weight Bearing: Non weight  bearing LLE Weight Bearing: Non weight bearing Cognition Overall Cognitive Status: Within Functional Limits for tasks assessed Safety/Judgment: Appears intact Comments: Pt still with anxiety with mobility but improved. Able to direct care. Sensation Sensation Light Touch: Impaired Detail Light Touch Impaired Details: Impaired LLE (reports numbness is improving some) Proprioception: Appears Intact Additional Comments: multiple bandages due to degloving and skin graft Coordination Gross Motor Movements are Fluid and Coordinated: Yes (LE's limited due to weakness and restrictions) Motor  Motor Motor: Within Functional Limits Motor - Skilled Clinical Observations: limited due to NWB BLE status and pain/weakness  Trunk/Postural Assessment  Cervical Assessment Cervical Assessment: Within Functional Limits Thoracic Assessment Thoracic Assessment: Within Functional Limits Lumbar Assessment Lumbar Assessment: Within Functional Limits Postural Control Postural Control: Within Functional Limits  Balance Balance Balance Assessed: Yes Static Sitting Balance Static Sitting - Level of Assistance: 6: Modified independent (Device/Increase time) Dynamic Sitting Balance Dynamic Sitting - Level of Assistance: 6: Modified independent (Device/Increase time) Extremity Assessment      RLE Assessment RLE Assessment: Exceptions to [facility] RLE Strength RLE Overall Strength Comments: 3/5 at knee and ankle; 3-/5 at hip LLE Assessment LLE Assessment: Exceptions to [facility] LLE Strength LLE Overall Strength Comments: 2+ to 3-/5 grossly; ankle 3/5; limited by pain.  See FIM for current functional status  Canary Brim Ivory Broad, PT, DPT 11/28/2014, 11:12 AM

## 2014-11-26 NOTE — Progress Notes (Signed)
Physical Therapy Session Note  Patient Details  Name: Lance Cline MRN: [id number] Date of Birth: Aug 28, 1975  Today's Date: 11/26/2014 PT Individual Time: 1020-1100 PT Individual Time Calculation (min): 40 min   Short Term Goals: Week 1:  PT Short Term Goal 1 (Week 1): Pt will be able to perform bed <-> w/c transfers with mod A PT Short Term Goal 2 (Week 1): Pt will be able to perform bed mobility with mod A on flat surface PT Short Term Goal 3 (Week 1): Pt will be able to initiate car transfer with mod A +2  Skilled Therapeutic Interventions/Progress Updates:    Pt missed 20 min of beginning of session due to PA changing dressing site on skin graft. Pt reports that the PA stated he would need to stay until at least Thurs due dressing change on Thursday.  Pt's nephew present and completed fam education in regards to bed mobility and transfer training (checked off on Safety plan) at min A level with pt directing transfer independently.  Pt declined going back to apartment to practice transfers on regular bed stating he has already shown that he can transfer. Educated on reason for doing it in a regular bed (without bed rails, etc) and pt states he would do it tomorrow.  Agreeable for outdoor community mobility training at w/c level to go over uneven surfaces, on and off elevator, and education on re-entry to the community as well as d/c planning and follow-up therapies. Pt reports he is ready to d/c and eager to go. He states he cannot stay another week here or he will "become irritable" and states he will not participate in therapies anymore. Left in room with all needs in reach and nephew present at end of session.   Therapy Documentation Precautions:  Precautions Precautions: Fall Precaution Comments: Skin graft donor site on R LE.  Restrictions Weight Bearing Restrictions: Yes RLE Weight Bearing: Non weight bearing LLE Weight Bearing: Non weight bearing General: PT Amount of Missed  Time (min): 20 Minutes PT Missed Treatment Reason: Wound care (PA changing dressing)    See FIM for current functional status  Therapy/Group: Individual Therapy  Karolee StampsGray, Mallie Linnemann Darrol PokeBrescia  AmeLie Hollars B. Markanthony Gedney, PT, DPT  11/26/2014, 12:15 PM
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
## 2014-11-27 ENCOUNTER — Inpatient Hospital Stay (HOSPITAL_COMMUNITY): Payer: Self-pay | Admitting: Rehabilitation

## 2014-11-27 ENCOUNTER — Inpatient Hospital Stay (HOSPITAL_COMMUNITY): Payer: Self-pay | Admitting: Occupational Therapy

## 2014-11-27 ENCOUNTER — Encounter (HOSPITAL_COMMUNITY): Payer: Self-pay | Admitting: Orthopedic Surgery

## 2014-11-27 DIAGNOSIS — S7292XD Unspecified fracture of left femur, subsequent encounter for closed fracture with routine healing: Secondary | ICD-10-CM

## 2014-11-27 DIAGNOSIS — S8412XA Injury of peroneal nerve at lower leg level, left leg, initial encounter: Secondary | ICD-10-CM | POA: Diagnosis present

## 2014-11-27 LAB — PROTIME-INR
INR: 2.03 — AB (ref 0.00–1.49)
Prothrombin Time: 22.8 seconds — ABNORMAL HIGH (ref 11.6–15.2)

## 2014-11-27 MED ORDER — WARFARIN SODIUM 7.5 MG PO TABS
7.5000 mg | ORAL_TABLET | Freq: Every day | ORAL | Status: DC
  Administered 2014-11-27 – 2014-11-28 (×2): 7.5 mg via ORAL
  Filled 2014-11-27 (×3): qty 1

## 2014-11-27 NOTE — Progress Notes (Signed)
Social Work Patient ID: [name], male   DOB: [birth year], 39 y.o.   MRN: [id number]  Have reviewed team conference info with pt and girlfriend.  Pt aware that team is awaiting decisions from ortho service regarding possible return to OR/ VAC placement.  He could discharge anytime from tomorrow to the weekend.  I am going to go ahead and order DME [facility]  Will coordinate ordering of home VAC once need confirmed.  Aniesa Boback, LCSW

## 2014-11-27 NOTE — Plan of Care (Signed)
Problem: RH BOWEL ELIMINATION Goal: RH STG MANAGE BOWEL WITH ASSISTANCE STG Manage Bowel with Mod Assistance.  Continued educating patient about importance of taking scheduled stool softener and laxative as the effects of his situation can have on his bowel such as decreased mobility, pain medication and diet.  Patient agreed to take both as scheduled for shift; positive results during night. Patient stated at that time, "Now I know why I take both because that was a little bit hard to get out."  Will continue to educate and monitor patient.
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
## 2014-11-27 NOTE — Progress Notes (Signed)
Occupational Therapy Session Note  Patient Details  Name: Lance Cline MRN: [id number] Date of Birth: [birth year]  Today's Date: 11/27/2014 OT Individual Time: 1330-1430 OT Individual Time Calculation (min): 60 min    Short Term Goals: Week 1:  OT Short Term Goal 1 (Week 1): [facility] OT Short Term Goal 2 (Week 1): Pt will complete LB dressing with min A OT Short Term Goal 3 (Week 1): Pt will complete toileting task with min A OT Short Term Goal 4 (Week 1): Pt will demonstrate carry over of education regarding compensatory stratiegies taught in OT session with min VCs  Skilled Therapeutic Interventions/Progress Updates:    Pt seen for OT session focusing on functional mobility and functional activity tolerance. Pt in supine upon arrival, agreeable to tx. Pt refused all practice with functional transfers, stating he felt comfortable with all functional transfers and did no wish to practice them anymore.  Pt transferred supine> EOB with min A for management of L LE. He completed transfer to w/Cline via scoot with min A. Pt self propelled w/Cline to off unit patio with assist to manage IV pole. Pt navigated through various terrains/ uneven surfaces and up/down inclines mod I with good safety awareness and control of w/Cline.  Seated in w/Cline, pt completed LE strengthening/ ROM exercises, completing x10 knee extensions and x10 hip extensions with R LE. Pt completed x10 reps on L LE of knee extensions, only able to kick foot ~4 inches off ground due to discomfort and weakness.  Education provided regarding importance of mobility and activity with LE LE jointsPt voiced feelings of increased shooting pain through L toes throughout session, denying pain but feeling "something different".   Pt provided with w/Cline gloves for use at d/Cline.   Pt returned to room at end of session, left sitting in w/Cline at end of session, caregiver present.    VCs  provided regarding need for pressure relief when  up in w/Cline, demonstration of technique provided and pt return demonstrated.  Education provided regarding positioning in supine, energy conservation, risk of bed sores/ pressure ulcers, and d/Cline planning.   Therapy Documentation Precautions:  Precautions Precautions: Fall Precaution Comments: Skin graft donor site on R LE.  Restrictions Weight Bearing Restrictions: Yes RLE Weight Bearing: Non weight bearing LLE Weight Bearing: Non weight bearing Pain: Pain Assessment Pain Assessment: 0-10 Pain Score: 8  Pain Type: Surgical pain Pain Location: Leg Pain Orientation: Left Pain Descriptors / Indicators: Aching Pain Frequency: Intermittent Pain Onset: On-going Patients Stated Pain Goal: 2 Pain Intervention(s): Medication (See eMAR);Repositioned Multiple Pain Sites: No  See FIM for current functional status  Therapy/Group: Individual Therapy  Lance Cline 11/27/2014, 12:53 PM
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
## 2014-11-27 NOTE — Progress Notes (Signed)
Physical Therapy Session Note  Patient Details  Name: Lance Cline MRN: [id number] Date of Birth: 01-Nov-1975  Today's Date: 11/27/2014 PT Individual Time: 0900-1000 PT Individual Time Calculation (min): 60 min   Short Term Goals: Week 1:  PT Short Term Goal 1 (Week 1): Pt will be able to perform bed <-> w/c transfers with mod A PT Short Term Goal 2 (Week 1): Pt will be able to perform bed mobility with mod A on flat surface PT Short Term Goal 3 (Week 1): Pt will be able to initiate car transfer with mod A +2  Skilled Therapeutic Interventions/Progress Updates:   Skilled session focused on D/C planning and checking off goals for safe D/C home.  Performed all w/c mobility at mod I level in controlled, home, and simulated community environment on unit (S for management of IV pole but will not have at home).  Caregiver present during session to assist with parts management and assisting pt with LE management during transfers.  Performed w/c<>bed transfer at min A level for management of LLE with pt better able to manage RLE during this transfer.  Cues for ensuring no weight through RLE.  Provided education on how to modify furniture at home to prevent pt from having to transfer to/from lower furniture.  Pt verbalized understanding.  Performed car transfer at mod A level for management of BLEs into/out of car with cues for safe set up and safe hand placement during transfer, as well as ensuring NWB on RLE.  Performed ramp for home entry at mod I level (again S for IV pole).  Pt states that he may like to go to fiance's house at some point, therefore provided education and handout on how to bump w/c up/down stairs since pt did not want to attempt during therapy session.  Educated that there must be TWO people for safety.  Educated on pressure relief while in w/c with pt verbalizing understanding.  Ended session with w/c propulsion x 200' around unit to simulate controlled, home and community environment at  mod I level (as above).  Left in room in w/c with all needs in reach.   Therapy Documentation Precautions:  Precautions Precautions: Fall Precaution Comments: Skin graft donor site on R LE.  Restrictions Weight Bearing Restrictions: Yes RLE Weight Bearing: Non weight bearing LLE Weight Bearing: Non weight bearing General: PT Amount of Missed Time (min): 30 Minutes PT Missed Treatment Reason: Patient fatigue   Pain: Pain Assessment Pain Assessment: 0-10 Pain Score: 7  Pain Type: Acute pain Pain Location: Leg Pain Orientation: Left Pain Descriptors / Indicators: Aching Pain Frequency: Intermittent Pain Onset: On-going Patients Stated Pain Goal: 3 Pain Intervention(s): Medication (See eMAR);Repositioned Multiple Pain Sites: No   Locomotion : Wheelchair Mobility Distance: 200   See FIM for current functional status  Therapy/Group: Individual Therapy  Vista Deckarcell, Aleane Wesenberg Ann 11/27/2014, 10:05 AM

## 2014-11-27 NOTE — Progress Notes (Signed)
Occupational Therapy Session Note  Patient Details  Name: Lance Cline MRN: [id number] Date of Birth: Apr 01, 1976  Today's Date: 11/27/2014 OT Individual Time: 1100-1200 OT Individual Time Calculation (min): 60 min    Short Term Goals: Week 1:  OT Short Term Goal 1 (Week 1): [facility] Short Term Goal 2 (Week 1): Pt will complete LB dressing with min A OT Short Term Goal 3 (Week 1): Pt will complete toileting task with min A OT Short Term Goal 4 (Week 1): Pt will demonstrate carry over of education regarding compensatory stratiegies taught in OT session with min VCs  Skilled Therapeutic Interventions/Progress Updates:    Pt seen for OT session focusing on functional transfers, functional activity tolerance, and family training. Pt in w/c upon arrival, agreeable to "light" therapy. Pt self propelled w/c to ADL apartment with assist to manage IV pole. Pt educated extensively regarding toilet transfer techniques to standard toilet and recommendations for completing entire toileting task. Pt refused to attempt toileting task with clothing management piece, however, agreeable to attempt transfer to/froom standard toilet. Pt self propelled w/c into bathroom with min VCs for problem solving best way to fit w/c in bathroom. Pt  Transferred 3/4 way onto toilet before voicing increased frustration and discomfort with desire to return to w/c. Pt states he's "not worried about any of this (toileting) at home, my girlfriend will do it all for me". When asked, pt unable to talk out how caregiver would provide support. OT educated regarding how best to assist. Education and recommendation given regarding use of BSC over toilet in order for pt to have B armrests to use to push self up while caregivers provided assist for clothing management. Pt educated regarding lateral leans to complete hygiene independently. Pt then voiced need for showering device as he learned his mother has  shower chair vs shower bench. He had planned to use her DME. Pt and caregivers educated regarding set-up and use of tub bench, declining to practice transfer.   Pt returned to room at end of session, and desired to get back into bed. Pt and caregiver able to complete transfer with mod A provided by caregiver for assist for B LEs. VCs provided for log rolling technique. Pt left in bed at end of session, bed alarm on, and all needs in reach.   Pt's mother present at very end of session and educated regarding DME discussed during tx session, and voiced understanding. OT to follow up with CSW regarding ordering of equipment.   Therapy Documentation Precautions:  Precautions Precautions: Fall Precaution Comments: Skin graft donor site on R LE.  Restrictions Weight Bearing Restrictions: Yes RLE Weight Bearing: Non weight bearing LLE Weight Bearing: Non weight bearing Pain: Pain Assessment Pain Assessment: 0-10 Pain Score: 5  Pain Type: Surgical pain Pain Location: Leg Pain Orientation: Left Pain Descriptors / Indicators: Aching Pain Frequency: Intermittent Pain Onset: On-going Patients Stated Pain Goal: 3 Pain Intervention(s): RN made aware;Repositioned;Ambulation/increased activity Multiple Pain Sites: No  See FIM for current functional status  Therapy/Group: Individual Therapy  [name], Timmia Cogburn C 11/27/2014, 7:23 AM
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
## 2014-11-28 ENCOUNTER — Inpatient Hospital Stay (HOSPITAL_COMMUNITY): Payer: Self-pay | Admitting: Occupational Therapy

## 2014-11-28 ENCOUNTER — Inpatient Hospital Stay (HOSPITAL_COMMUNITY): Payer: MEDICAID

## 2014-11-28 DIAGNOSIS — S8412XS Injury of peroneal nerve at lower leg level, left leg, sequela: Secondary | ICD-10-CM

## 2014-11-28 LAB — PROTIME-INR
INR: 2.04 — AB (ref 0.00–1.49)
Prothrombin Time: 22.9 seconds — ABNORMAL HIGH (ref 11.6–15.2)

## 2014-11-28 MED ORDER — HYDROMORPHONE HCL 1 MG/ML IJ SOLN
1.0000 mg | Freq: Once | INTRAMUSCULAR | Status: AC
  Administered 2014-11-28: 1 mg via INTRAVENOUS
  Filled 2014-11-28: qty 1

## 2014-11-28 MED ORDER — MORPHINE SULFATE 15 MG PO TABS
30.0000 mg | ORAL_TABLET | Freq: Four times a day (QID) | ORAL | Status: DC | PRN
  Administered 2014-11-28 – 2014-11-29 (×6): 30 mg via ORAL
  Filled 2014-11-28 (×7): qty 2

## 2014-11-28 NOTE — Discharge Summary (Signed)
Discharge summary job # [id number]
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
## 2014-11-28 NOTE — Progress Notes (Addendum)
Occupational Therapy Note  Patient Details  Name: Lance Cline MRN: [id number] Date of Birth: 1976-02-12  Today's Date: 11/28/2014 OT Missed Time:  60 Minutes  Missed Time Reason: Patient unwilling/refused to participate without medical reason  Pt unwilling to participate in OT session/ education, as pt with pending d/c today once medically clear. Pt voiced no concerns regarding d/c home and states that family able to provide all necessary assist for ADL/IADL needs. Pt's caregivers in agreement. Extensive education provided throughout stay regarding modified bathing/ dressing techniques, DME, and recommendations for providing assist.    Melvyn Neth, Kristian Covey 11/28/2014, 12:46 PM
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
## 2014-11-29 ENCOUNTER — Inpatient Hospital Stay (HOSPITAL_COMMUNITY): Payer: Self-pay

## 2014-11-29 DIAGNOSIS — S7291XS Unspecified fracture of right femur, sequela: Secondary | ICD-10-CM

## 2014-11-29 DIAGNOSIS — S7292XS Unspecified fracture of left femur, sequela: Secondary | ICD-10-CM

## 2014-11-29 LAB — CBC
HCT: 30.3 % — ABNORMAL LOW (ref 39.0–52.0)
Hemoglobin: 9.7 g/dL — ABNORMAL LOW (ref 13.0–17.0)
MCH: 28 pg (ref 26.0–34.0)
MCHC: 32 g/dL (ref 30.0–36.0)
MCV: 87.3 fL (ref 78.0–100.0)
PLATELETS: 411 10*3/uL — AB (ref 150–400)
RBC: 3.47 MIL/uL — ABNORMAL LOW (ref 4.22–5.81)
RDW: 15.2 % (ref 11.5–15.5)
WBC: 6.1 10*3/uL (ref 4.0–10.5)

## 2014-11-29 LAB — PROTIME-INR
INR: 2.11 — AB (ref 0.00–1.49)
Prothrombin Time: 23.5 seconds — ABNORMAL HIGH (ref 11.6–15.2)

## 2014-11-29 MED ORDER — LEVOFLOXACIN 500 MG PO TABS
500.0000 mg | ORAL_TABLET | Freq: Every day | ORAL | Status: DC
  Administered 2014-11-29: 500 mg via ORAL
  Filled 2014-11-29 (×2): qty 1

## 2014-11-29 MED ORDER — OXYCODONE HCL 10 MG PO TABS: 10.0000 mg | ORAL_TABLET | ORAL | Status: DC | PRN

## 2014-11-29 MED ORDER — OXYCODONE HCL 5 MG PO TABS: 10.0000 mg | ORAL_TABLET | ORAL | Status: DC | PRN

## 2014-11-29 MED ORDER — LEVOFLOXACIN 500 MG PO TABS: 500.0000 mg | ORAL_TABLET | Freq: Every day | ORAL | Status: DC

## 2014-11-29 MED ORDER — SENNOSIDES-DOCUSATE SODIUM 8.6-50 MG PO TABS: 2.0000 | ORAL_TABLET | Freq: Every day | ORAL | Status: DC

## 2014-11-29 MED ORDER — MORPHINE SULFATE 30 MG PO TABS: 30.0000 mg | ORAL_TABLET | Freq: Four times a day (QID) | ORAL | Status: DC | PRN

## 2014-11-29 MED ORDER — WARFARIN SODIUM 5 MG PO TABS: ORAL_TABLET | ORAL | Status: DC

## 2014-11-29 MED ORDER — WARFARIN SODIUM 5 MG PO TABS
5.0000 mg | ORAL_TABLET | Freq: Every day | ORAL | Status: DC
  Filled 2014-11-29: qty 1

## 2014-11-29 MED ORDER — METHOCARBAMOL 500 MG PO TABS: 1000.0000 mg | ORAL_TABLET | Freq: Four times a day (QID) | ORAL | Status: DC | PRN
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
## 2014-11-29 NOTE — Progress Notes (Signed)
Pt. Got d/c instructions,prescriptions and follow up appointments.Pt. Ready to go home with his family 

## 2014-11-29 NOTE — Progress Notes (Signed)
Social Work  Discharge Note  The overall goal for the admission was met for:   Discharge location: Yes - d/c to parents' home  Length of Stay: Yes - 9 days  Discharge activity level: Yes - min assist overall @ w/c level  Home/community participation: Yes  Services provided included: MD, RD, PT, OT, SLP, RN, TR, Pharmacy and SW  Financial Services: Other: None  Follow-up services arranged: Home Health: RN, PT via [place], DME: 18x18 lightweight w/ with ELRs, basic cushion, drop arm commode and tub bench via [place], Other: referred to [facility] for transportation;  referred to [facility] for medication assistance;  referred to [facility] for primary care;  [facility] providing wound VAC  and Patient/Family has no preference for HH/DME agencies  Comments (or additional information):  Patient/Family verbalized understanding of follow-up arrangements: Yes  Individual responsible for coordination of the follow-up plan: pt  Confirmed correct DME delivered: Kemia Wendel 11/29/2014    Janilah Hojnacki

## 2014-11-29 NOTE — Progress Notes (Signed)
MSIR is not available to the medication assistance program thus will change to oxycodone immediate release 10 mg every 4 hours as needed. All issues discussed with patient
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
## 2014-11-29 NOTE — Discharge Instructions (Signed)
Inpatient Rehab Discharge Instructions  Lance Cline Discharge date and time: No discharge date for patient encounter.   Activities/Precautions/ Functional Status: Activity: Nonweightbearing bilateral lower extremities Diet: regular diet Wound Care:  Functional status:  ___ No restrictions     ___ Walk up steps independently ___ 24/7 supervision/assistance   ___ Walk up steps with assistance ___ Intermittent supervision/assistance  ___ Bathe/dress independently ___ Walk with walker     ___ Bathe/dress with assistance ___ Walk Independently    ___ Shower independently ___ Walk with assistance    ___ Shower with assistance ___ No alcohol     ___ Return to work/school ________    COMMUNITY REFERRALS UPON DISCHARGE:  Home Health:   PT        RN                   Agency: [facility] Phone: [phone]    Medical Equipment/Items Ordered: wheelchair, cushion, commode and tub bench                                                     Agency/Supplier: [facility] [phone]  Other: home wound VAC - [facility] @ [phone]    Special Instructions: Wound VAC changes every 3-4 days  White foam exposed IT band and posterior proximal wound. Black foam over white foam placed on continuous medium suction 136mm/HG   Home health nurse to check INR on Wednesday, 12/04/2014 results to [facility] [phone].   My questions have been answered and I understand these instructions. I will adhere to these goals and the provided educational materials after my discharge from the hospital.  Patient/Caregiver Signature _______________________________ Date __________  Clinician Signature _______________________________________ Date __________  Please bring this form and your medication list with you to all your follow-up doctor's appointments.
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
## 2014-12-02 NOTE — Plan of Care (Signed)
Problem: RH Bathing Goal: LTG Patient will bathe with assist, cues/equipment (OT) LTG: Patient will bathe specified number of body parts with assist with/without cues using equipment (position) (OT)  Outcome: Not Met (add Reason) Pt refused all bathing tasks during therapy stay reporting that his girlfriend bathed him. He refused all education and trials in order to increase independence with self care tasks, stating that he would have assist to complete tasks upon d/c, and did not wish to gain independence with self care tasks.   Problem: RH Dressing Goal: LTG Patient will perform lower body dressing w/assist (OT) LTG: Patient will perform lower body dressing with assist, with/without cues in positioning using equipment (OT)  Outcome: Not Met (add Reason) He refused all education and trials in order to increase independence with LB dressing tasks, stating that he would have assist to complete tasks upon d/c, and did not wish to gain independence with LB dressing  Problem: RH Toileting Goal: LTG Patient will perform toileting w/assist, cues/equip (OT) LTG: Patient will perform toiletiing (clothes management/hygiene) with assist, with/without cues using equipment (OT)  Outcome: Not Met (add Reason) Pt refused toileting task during therapy time, and used bed pan during rehab stay despite recommendations to use padded tub bench for toileting. He refused all education and trials in order to increase independence with toileting tasks, stating that he would have assist to complete tasks upon d/c, and did not wish to gain independence with toileting task.

## 2014-12-11 ENCOUNTER — Inpatient Hospital Stay: Payer: Self-pay | Admitting: Internal Medicine

## 2014-12-19 DIAGNOSIS — S32402D Unspecified fracture of left acetabulum, subsequent encounter for fracture with routine healing: Secondary | ICD-10-CM

## 2014-12-19 DIAGNOSIS — Z5181 Encounter for therapeutic drug level monitoring: Secondary | ICD-10-CM

## 2014-12-19 DIAGNOSIS — S728X1D Other fracture of right femur, subsequent encounter for closed fracture with routine healing: Secondary | ICD-10-CM

## 2014-12-19 DIAGNOSIS — Z7901 Long term (current) use of anticoagulants: Secondary | ICD-10-CM

## 2014-12-19 DIAGNOSIS — D500 Iron deficiency anemia secondary to blood loss (chronic): Secondary | ICD-10-CM

## 2014-12-19 DIAGNOSIS — F1020 Alcohol dependence, uncomplicated: Secondary | ICD-10-CM

## 2014-12-19 DIAGNOSIS — S728X2D Other fracture of left femur, subsequent encounter for closed fracture with routine healing: Secondary | ICD-10-CM

## 2014-12-19 DIAGNOSIS — S3282XD Multiple fractures of pelvis without disruption of pelvic ring, subsequent encounter for fracture with routine healing: Secondary | ICD-10-CM

## 2015-02-10 ENCOUNTER — Ambulatory Visit: Payer: No Typology Code available for payment source | Attending: Orthopedic Surgery | Admitting: Physical Therapy

## 2015-02-25 ENCOUNTER — Ambulatory Visit: Payer: Self-pay | Attending: Orthopedic Surgery | Admitting: Physical Therapy

## 2015-02-25 DIAGNOSIS — S32810D Multiple fractures of pelvis with stable disruption of pelvic ring, subsequent encounter for fracture with routine healing: Secondary | ICD-10-CM | POA: Insufficient documentation

## 2015-02-25 DIAGNOSIS — R2681 Unsteadiness on feet: Secondary | ICD-10-CM | POA: Insufficient documentation

## 2015-02-25 DIAGNOSIS — M25562 Pain in left knee: Secondary | ICD-10-CM | POA: Insufficient documentation

## 2015-02-25 DIAGNOSIS — R262 Difficulty in walking, not elsewhere classified: Secondary | ICD-10-CM | POA: Insufficient documentation

## 2015-02-25 DIAGNOSIS — M2569 Stiffness of other specified joint, not elsewhere classified: Secondary | ICD-10-CM

## 2015-02-25 DIAGNOSIS — M21372 Foot drop, left foot: Secondary | ICD-10-CM | POA: Insufficient documentation

## 2015-02-25 DIAGNOSIS — Z723 Lack of physical exercise: Secondary | ICD-10-CM | POA: Insufficient documentation

## 2015-02-25 DIAGNOSIS — R29898 Other symptoms and signs involving the musculoskeletal system: Secondary | ICD-10-CM | POA: Insufficient documentation

## 2015-02-25 DIAGNOSIS — M25669 Stiffness of unspecified knee, not elsewhere classified: Secondary | ICD-10-CM

## 2015-02-25 DIAGNOSIS — M2560 Stiffness of unspecified joint, not elsewhere classified: Secondary | ICD-10-CM

## 2015-02-25 NOTE — Patient Instructions (Addendum)
Cryotherapy Cryotherapy means treatment with cold. Ice or gel packs can be used to reduce both pain and swelling. Ice is the most helpful within the first 24 to 48 hours after an injury or flare-up from overusing a muscle or joint. Sprains, strains, spasms, burning pain, shooting pain, and aches can all be eased with ice. Ice can also be used when recovering from surgery. Ice is effective, has very few side effects, and is safe for most people to use. PRECAUTIONS  Ice is not a safe treatment option for people with:  Raynaud phenomenon. This is a condition affecting small blood vessels in the extremities. Exposure to cold may cause your problems to return.  Cold hypersensitivity. There are many forms of cold hypersensitivity, including:  Cold urticaria. Red, itchy hives appear on the skin when the tissues begin to warm after being iced.  Cold erythema. This is a red, itchy rash caused by exposure to cold.  Cold hemoglobinuria. Red blood cells break down when the tissues begin to warm after being iced. The hemoglobin that carry oxygen are passed into the urine because they cannot combine with blood proteins fast enough.  Numbness or altered sensitivity in the area being iced. If you have any of the following conditions, do not use ice until you have discussed cryotherapy with your caregiver:  Heart conditions, such as arrhythmia, angina, or chronic heart disease.  High blood pressure.  Healing wounds or open skin in the area being iced.  Current infections.  Rheumatoid arthritis.  Poor circulation.  Diabetes. Ice slows the blood flow in the region it is applied. This is beneficial when trying to stop inflamed tissues from spreading irritating chemicals to surrounding tissues. However, if you expose your skin to cold temperatures for too long or without the proper protection, you can damage your skin or nerves. Watch for signs of skin damage due to cold. HOME CARE INSTRUCTIONS Follow  these tips to use ice and cold packs safely.  Place a dry or damp towel between the ice and skin. A damp towel will cool the skin more quickly, so you may need to shorten the time that the ice is used.  For a more rapid response, add gentle compression to the ice.  Ice for no more than 10 to 20 minutes at a time. The bonier the area you are icing, the less time it will take to get the benefits of ice.  Check your skin after 5 minutes to make sure there are no signs of a poor response to cold or skin damage.  Rest 20 minutes or more between uses.  Once your skin is numb, you can end your treatment. You can test numbness by very lightly touching your skin. The touch should be so light that you do not see the skin dimple from the pressure of your fingertip. When using ice, most people will feel these normal sensations in this order: cold, burning, aching, and numbness.  Do not use ice on someone who cannot communicate their responses to pain, such as small children or people with dementia. HOW TO MAKE AN ICE PACK Ice packs are the most common way to use ice therapy. Other methods include ice massage, ice baths, and cryosprays. Muscle creams that cause a cold, tingly feeling do not offer the same benefits that ice offers and should not be used as a substitute unless recommended by your caregiver. To make an ice pack, do one of the following:  Place crushed ice or a  bag of frozen vegetables in a sealable plastic bag. Squeeze out the excess air. Place this bag inside another plastic bag. Slide the bag into a pillowcase or place a damp towel between your skin and the bag.  Mix 3 parts water with 1 part rubbing alcohol. Freeze the mixture in a sealable plastic bag. When you remove the mixture from the freezer, it will be slushy. Squeeze out the excess air. Place this bag inside another plastic bag. Slide the bag into a pillowcase or place a damp towel between your skin and the bag. SEEK MEDICAL CARE  IF:  You develop white spots on your skin. This may give the skin a blotchy (mottled) appearance.  Your skin turns blue or pale.  Your skin becomes waxy or hard.  Your swelling gets worse. MAKE SURE YOU:   Understand these instructions.  Will watch your condition.  Will get help right away if you are not doing well or get worse. Document Released: 02/01/2011 Document Revised: 10/22/2013 Document Reviewed: 02/01/2011 [facility] Patient Information 2015 [place], [place]. This information is not intended to replace advice given to you by your health care provider. Make sure you discuss any questions you have with your health care provider.   Pt given Handout for Knee level 1 strengthening in supine plus eccentrically controlled SLR on handout  Garen Lah, PT 02/25/2015 9:10 AM Phone: [phone] Fax: [phone]
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
## 2015-03-06 ENCOUNTER — Ambulatory Visit: Payer: Self-pay | Admitting: Physical Therapy

## 2015-03-06 DIAGNOSIS — R2681 Unsteadiness on feet: Secondary | ICD-10-CM

## 2015-03-06 DIAGNOSIS — M2569 Stiffness of other specified joint, not elsewhere classified: Secondary | ICD-10-CM

## 2015-03-06 DIAGNOSIS — R262 Difficulty in walking, not elsewhere classified: Secondary | ICD-10-CM

## 2015-03-06 DIAGNOSIS — M25562 Pain in left knee: Secondary | ICD-10-CM

## 2015-03-06 DIAGNOSIS — Z723 Lack of physical exercise: Secondary | ICD-10-CM

## 2015-03-06 DIAGNOSIS — M21372 Foot drop, left foot: Secondary | ICD-10-CM

## 2015-03-06 DIAGNOSIS — M25669 Stiffness of unspecified knee, not elsewhere classified: Secondary | ICD-10-CM

## 2015-03-06 DIAGNOSIS — M2560 Stiffness of unspecified joint, not elsewhere classified: Secondary | ICD-10-CM

## 2015-03-06 DIAGNOSIS — S32810D Multiple fractures of pelvis with stable disruption of pelvic ring, subsequent encounter for fracture with routine healing: Secondary | ICD-10-CM
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
## 2015-03-06 NOTE — Patient Instructions (Addendum)
Hip Extension (Prone)   Lift left leg __6__ inches from floor, keeping knee locked. Repeat _10___ times per set. Do ___2-3_ sets per session. Do __2__ sessions per day.  http://orth.exer.us/98   Copyright  VHI. All rights reserved.  Hip Adduction: Leg Lift (Eccentric) - Side-Lying   Lie on side with top leg bent, foot flat behind lower leg. Quickly lift lower leg. Slowly lower for 3-5 seconds. _10__ reps per set, _2__ sets per session, __7_ days per week. Add _2__ lbs when you achieve _15 __ repetitions.  Allow [name] to lift leg and you slowly lower until you get strength.  Copyright  VHI. All rights reserved.  Abduction: Side Leg Lift (Eccentric) - Side-Lying   Lie on side. Lift top leg slightly higher than shoulder level. Keep top leg straight with body, toes pointing forward. Slowly lower for 3-5 seconds. _10__ reps per set, 2__ sets per day, 7___ days per week. Add __2_ lbs when you achieve _15__ repetitions. In a row with good motor control  Copyright  VHI. All rights reserved.  Strengthening: Straight Leg Raise (Phase 1)   Tighten muscles on front of right thigh, then lift leg __6__ inches from surface, keeping knee locked.  Repeat _10___ times per set. Do _3___ sets per session. Do _2___ sessions per day.  http://orth.exer.us/614   Copyright  VHI. All rights reserved. Flexion: Stretch - Quadriceps (Prone)   Position Helper: Place one hand on shin near left ankle. Stabilize buttock to keep hip flat on bed. Motion - Helper presses heel toward buttock slowly. CAUTION: Patient should feel stretch along front of thigh. There should be no pain in knee joint. Hold 30- 60___ seconds. Repeat _2-3__ times. Repeat with other leg. Do _2__ sessions per day.   Copyright  VHI. All rights reserved.   [name] can help you with foot dorsiflexion stretch as shown in clinic   Garen Lah, PT 03/06/2015 9:05 AM Phone: [phone] Fax: [phone]

## 2015-03-10 ENCOUNTER — Ambulatory Visit: Payer: Self-pay | Admitting: Physical Therapy

## 2015-03-13 ENCOUNTER — Ambulatory Visit: Payer: Self-pay | Admitting: Physical Therapy

## 2015-03-13 DIAGNOSIS — Z723 Lack of physical exercise: Secondary | ICD-10-CM

## 2015-03-13 DIAGNOSIS — S32810D Multiple fractures of pelvis with stable disruption of pelvic ring, subsequent encounter for fracture with routine healing: Secondary | ICD-10-CM

## 2015-03-13 DIAGNOSIS — R2681 Unsteadiness on feet: Secondary | ICD-10-CM

## 2015-03-13 DIAGNOSIS — M2560 Stiffness of unspecified joint, not elsewhere classified: Secondary | ICD-10-CM

## 2015-03-13 DIAGNOSIS — R262 Difficulty in walking, not elsewhere classified: Secondary | ICD-10-CM

## 2015-03-13 DIAGNOSIS — M25562 Pain in left knee: Secondary | ICD-10-CM

## 2015-03-13 DIAGNOSIS — M21372 Foot drop, left foot: Secondary | ICD-10-CM

## 2015-03-13 DIAGNOSIS — M2569 Stiffness of other specified joint, not elsewhere classified: Secondary | ICD-10-CM

## 2015-03-13 DIAGNOSIS — M25669 Stiffness of unspecified knee, not elsewhere classified: Secondary | ICD-10-CM
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
## 2015-03-13 NOTE — Patient Instructions (Addendum)
Advance Knee Strengthener   Leaning on wall, bend knees and slowly lower body until legs form a 90 angle. Return. Inhale while lowering, and exhale while raising the body. Repeat ____ times. Do ____ sessions per day. Hold on to kitchen chairs for support and make sure your knee doesn't go past your second toe.    http://gt2.exer.us/271   Copyright  VHI. All rights reserved.   Garen Lah, PT 03/13/2015 9:09 AM Phone: [phone] Fax: [phone]

## 2015-03-18 ENCOUNTER — Ambulatory Visit: Payer: Self-pay | Admitting: Physical Therapy

## 2015-03-20 ENCOUNTER — Ambulatory Visit: Payer: Self-pay | Admitting: Physical Therapy

## 2015-03-20 DIAGNOSIS — R2681 Unsteadiness on feet: Secondary | ICD-10-CM

## 2015-03-20 DIAGNOSIS — R262 Difficulty in walking, not elsewhere classified: Secondary | ICD-10-CM

## 2015-03-20 DIAGNOSIS — M25562 Pain in left knee: Secondary | ICD-10-CM

## 2015-03-20 DIAGNOSIS — M2560 Stiffness of unspecified joint, not elsewhere classified: Secondary | ICD-10-CM

## 2015-03-20 DIAGNOSIS — M25669 Stiffness of unspecified knee, not elsewhere classified: Secondary | ICD-10-CM

## 2015-03-20 DIAGNOSIS — Z723 Lack of physical exercise: Secondary | ICD-10-CM

## 2015-03-20 DIAGNOSIS — M2569 Stiffness of other specified joint, not elsewhere classified: Secondary | ICD-10-CM

## 2015-03-20 DIAGNOSIS — M21372 Foot drop, left foot: Secondary | ICD-10-CM

## 2015-03-20 DIAGNOSIS — S32810D Multiple fractures of pelvis with stable disruption of pelvic ring, subsequent encounter for fracture with routine healing: Secondary | ICD-10-CM

## 2015-03-20 NOTE — Patient Instructions (Signed)
Step Down: Anterior   From 4-6" step, reach one leg forward as far as possible, still able to return easily. Touch toe and heel to floor. Return to single leg balance. Repeat with other leg. Repeat __10__ times. Do _2-3___ sessions per day. Please hold onto stair rail for safety.  Dorsifle left foot as able   Copyright  VHI. All rights reserved.  Step-Up: Lateral   Step up to side with right leg. Bring other foot up onto _6___ inch step. Return to floor position with left leg. Repeat _10 x 3___ times per session. Do __1-2__ sessions per day.   Copyright  VHI. All rights reserved.  Forward   Facing step, place one leg on step, flexed at hip. Step up slowly, bringing hips in line with knee and shoulder. Bring other foot onto step. Reverse process to step back down. Repeat with other leg. Do _3 x 10___ repetitions, 1-2 times a day..  http://bt.exer.us/154   Copyright  VHI. All rights reserved.  Lance Cline, PT 03/20/2015 9:24 AM Phone: [phone] Fax: [phone]
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
## 2015-03-24 ENCOUNTER — Ambulatory Visit: Payer: Self-pay | Attending: Orthopedic Surgery | Admitting: Physical Therapy

## 2015-03-24 DIAGNOSIS — M25562 Pain in left knee: Secondary | ICD-10-CM | POA: Insufficient documentation

## 2015-03-24 DIAGNOSIS — S32810D Multiple fractures of pelvis with stable disruption of pelvic ring, subsequent encounter for fracture with routine healing: Secondary | ICD-10-CM | POA: Insufficient documentation

## 2015-03-24 DIAGNOSIS — R2681 Unsteadiness on feet: Secondary | ICD-10-CM | POA: Insufficient documentation

## 2015-03-24 DIAGNOSIS — R29898 Other symptoms and signs involving the musculoskeletal system: Secondary | ICD-10-CM | POA: Insufficient documentation

## 2015-03-24 DIAGNOSIS — R262 Difficulty in walking, not elsewhere classified: Secondary | ICD-10-CM | POA: Insufficient documentation

## 2015-03-24 DIAGNOSIS — Z723 Lack of physical exercise: Secondary | ICD-10-CM | POA: Insufficient documentation

## 2015-03-24 DIAGNOSIS — M21372 Foot drop, left foot: Secondary | ICD-10-CM | POA: Insufficient documentation

## 2015-03-26 ENCOUNTER — Ambulatory Visit: Payer: Self-pay | Admitting: Physical Therapy

## 2015-03-26 DIAGNOSIS — R262 Difficulty in walking, not elsewhere classified: Secondary | ICD-10-CM

## 2015-03-26 DIAGNOSIS — M25562 Pain in left knee: Secondary | ICD-10-CM

## 2015-03-26 DIAGNOSIS — R2681 Unsteadiness on feet: Secondary | ICD-10-CM

## 2015-03-26 DIAGNOSIS — M25669 Stiffness of unspecified knee, not elsewhere classified: Secondary | ICD-10-CM

## 2015-03-26 DIAGNOSIS — Z723 Lack of physical exercise: Secondary | ICD-10-CM
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
## 2015-03-31 ENCOUNTER — Ambulatory Visit: Payer: Self-pay | Admitting: Physical Therapy

## 2015-04-02 ENCOUNTER — Ambulatory Visit: Payer: Self-pay | Admitting: Physical Therapy

## 2015-04-02 DIAGNOSIS — R2681 Unsteadiness on feet: Secondary | ICD-10-CM

## 2015-04-02 DIAGNOSIS — M21372 Foot drop, left foot: Secondary | ICD-10-CM

## 2015-04-02 DIAGNOSIS — R262 Difficulty in walking, not elsewhere classified: Secondary | ICD-10-CM

## 2015-04-02 DIAGNOSIS — Z723 Lack of physical exercise: Secondary | ICD-10-CM

## 2015-04-02 DIAGNOSIS — M2569 Stiffness of other specified joint, not elsewhere classified: Secondary | ICD-10-CM

## 2015-04-02 DIAGNOSIS — M25669 Stiffness of unspecified knee, not elsewhere classified: Secondary | ICD-10-CM

## 2015-04-02 DIAGNOSIS — M2560 Stiffness of unspecified joint, not elsewhere classified: Secondary | ICD-10-CM

## 2015-04-02 DIAGNOSIS — M25562 Pain in left knee: Secondary | ICD-10-CM
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
## 2015-04-07 ENCOUNTER — Ambulatory Visit: Payer: Self-pay | Admitting: Physical Therapy

## 2015-04-09 ENCOUNTER — Ambulatory Visit: Payer: Self-pay | Admitting: Physical Therapy

## 2015-04-14 ENCOUNTER — Ambulatory Visit: Payer: Self-pay | Admitting: Physical Therapy

## 2015-04-14 DIAGNOSIS — S32810D Multiple fractures of pelvis with stable disruption of pelvic ring, subsequent encounter for fracture with routine healing: Secondary | ICD-10-CM

## 2015-04-14 DIAGNOSIS — M25669 Stiffness of unspecified knee, not elsewhere classified: Secondary | ICD-10-CM

## 2015-04-14 DIAGNOSIS — M21372 Foot drop, left foot: Secondary | ICD-10-CM

## 2015-04-14 DIAGNOSIS — R2681 Unsteadiness on feet: Secondary | ICD-10-CM

## 2015-04-14 DIAGNOSIS — Z723 Lack of physical exercise: Secondary | ICD-10-CM

## 2015-04-14 DIAGNOSIS — R262 Difficulty in walking, not elsewhere classified: Secondary | ICD-10-CM

## 2015-04-14 DIAGNOSIS — M25562 Pain in left knee: Secondary | ICD-10-CM

## 2015-04-14 DIAGNOSIS — M2560 Stiffness of unspecified joint, not elsewhere classified: Secondary | ICD-10-CM

## 2015-04-14 DIAGNOSIS — M2569 Stiffness of other specified joint, not elsewhere classified: Secondary | ICD-10-CM
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
## 2015-04-16 ENCOUNTER — Ambulatory Visit: Payer: Self-pay | Admitting: Physical Therapy

## 2015-04-21 ENCOUNTER — Ambulatory Visit: Payer: Self-pay | Admitting: Physical Therapy

## 2015-04-24 ENCOUNTER — Ambulatory Visit: Payer: No Typology Code available for payment source | Attending: Orthopedic Surgery | Admitting: Physical Therapy

## 2015-04-24 ENCOUNTER — Telehealth: Payer: Self-pay | Admitting: Physical Therapy

## 2015-04-24 NOTE — Telephone Encounter (Signed)
[facility] [phone].  Pt phone rings and no message or no answer.  PT let phone ring for 2 minutes x 2   Garen LahLawrie Coleson Kant, PT 04/24/2015 9:00 AM Phone: [phone] Fax: [phone]

## 2015-07-07 ENCOUNTER — Encounter (HOSPITAL_COMMUNITY): Payer: Self-pay | Admitting: *Deleted

## 2015-07-07 NOTE — Progress Notes (Signed)
Pt denies SOB, chest pain, and being under the care of a cardiologist. Pt denies having a stress test,echo and cardiac cath. Pt denies having an EKG within the last year. Pt made aware to stop  taking Aspirin, otc vitamins  and herbal medications. Do not take any NSAIDs ie: Ibuprofen, Advil, Naproxen or any medication containing Aspirin. Pt verbalized understanding of all pre-op instructions.  

## 2015-07-08 ENCOUNTER — Encounter (HOSPITAL_COMMUNITY)
Admission: RE | Disposition: A | Discharge status: Home / Self Care | Payer: Self-pay | Source: Ambulatory Visit | Attending: Orthopedic Surgery

## 2015-07-08 ENCOUNTER — Ambulatory Visit (HOSPITAL_COMMUNITY): Payer: No Typology Code available for payment source | Admitting: Anesthesiology

## 2015-07-08 ENCOUNTER — Ambulatory Visit (HOSPITAL_COMMUNITY): Payer: No Typology Code available for payment source

## 2015-07-08 ENCOUNTER — Encounter (HOSPITAL_COMMUNITY): Payer: Self-pay | Admitting: *Deleted

## 2015-07-08 ENCOUNTER — Ambulatory Visit (HOSPITAL_COMMUNITY)
Admission: RE | Admit: 2015-07-08 | Discharge: 2015-07-08 | Disposition: A | Discharge status: Home / Self Care | Payer: No Typology Code available for payment source | Source: Ambulatory Visit | Attending: Orthopedic Surgery | Admitting: Orthopedic Surgery

## 2015-07-08 DIAGNOSIS — F17200 Nicotine dependence, unspecified, uncomplicated: Secondary | ICD-10-CM | POA: Insufficient documentation

## 2015-07-08 DIAGNOSIS — M24562 Contracture, left knee: Secondary | ICD-10-CM | POA: Insufficient documentation

## 2015-07-08 DIAGNOSIS — Z419 Encounter for procedure for purposes other than remedying health state, unspecified: Secondary | ICD-10-CM

## 2015-07-08 HISTORY — PX: KNEE CLOSED REDUCTION: SHX995

## 2015-07-08 HISTORY — PX: HARDWARE REMOVAL: SHX979

## 2015-07-08 LAB — CBC
HEMATOCRIT: 43.4 % (ref 39.0–52.0)
Hemoglobin: 15.1 g/dL (ref 13.0–17.0)
MCH: 29.7 pg (ref 26.0–34.0)
MCHC: 34.8 g/dL (ref 30.0–36.0)
MCV: 85.4 fL (ref 78.0–100.0)
Platelets: 280 10*3/uL (ref 150–400)
RBC: 5.08 MIL/uL (ref 4.22–5.81)
RDW: 15.3 % (ref 11.5–15.5)
WBC: 7.1 10*3/uL (ref 4.0–10.5)

## 2015-07-08 SURGERY — REMOVAL, HARDWARE
Anesthesia: General | Site: Thigh | Laterality: Left

## 2015-07-08 MED ORDER — SUGAMMADEX SODIUM 200 MG/2ML IV SOLN
INTRAVENOUS | Status: AC
  Filled 2015-07-08: qty 2

## 2015-07-08 MED ORDER — ARTIFICIAL TEARS OP OINT
TOPICAL_OINTMENT | OPHTHALMIC | Status: AC
  Filled 2015-07-08: qty 3.5

## 2015-07-08 MED ORDER — ROCURONIUM BROMIDE 50 MG/5ML IV SOLN
INTRAVENOUS | Status: AC
  Filled 2015-07-08: qty 1

## 2015-07-08 MED ORDER — SUGAMMADEX SODIUM 200 MG/2ML IV SOLN
INTRAVENOUS | Status: DC | PRN
  Administered 2015-07-08: 200 mg via INTRAVENOUS

## 2015-07-08 MED ORDER — SODIUM CHLORIDE 0.9 % IJ SOLN
INTRAMUSCULAR | Status: AC
  Filled 2015-07-08: qty 10

## 2015-07-08 MED ORDER — PROPOFOL 10 MG/ML IV BOLUS
INTRAVENOUS | Status: AC
  Filled 2015-07-08: qty 20

## 2015-07-08 MED ORDER — CHLORHEXIDINE GLUCONATE 4 % EX LIQD: 60.0000 mL | Freq: Once | CUTANEOUS | Status: DC

## 2015-07-08 MED ORDER — CEFAZOLIN SODIUM-DEXTROSE 2-3 GM-% IV SOLR
2.0000 g | INTRAVENOUS | Status: AC
  Administered 2015-07-08: 2 g via INTRAVENOUS
  Filled 2015-07-08: qty 50

## 2015-07-08 MED ORDER — PROMETHAZINE HCL 12.5 MG PO TABS: 12.5000 mg | ORAL_TABLET | Freq: Four times a day (QID) | ORAL | Status: DC | PRN

## 2015-07-08 MED ORDER — LIDOCAINE HCL (CARDIAC) 20 MG/ML IV SOLN
INTRAVENOUS | Status: DC | PRN
  Administered 2015-07-08: 40 mg via INTRATRACHEAL

## 2015-07-08 MED ORDER — ONDANSETRON HCL 4 MG/2ML IJ SOLN
INTRAMUSCULAR | Status: AC
  Filled 2015-07-08: qty 2

## 2015-07-08 MED ORDER — EPHEDRINE SULFATE 50 MG/ML IJ SOLN
INTRAMUSCULAR | Status: AC
  Filled 2015-07-08: qty 1

## 2015-07-08 MED ORDER — KETOROLAC TROMETHAMINE 10 MG PO TABS: 10.0000 mg | ORAL_TABLET | Freq: Four times a day (QID) | ORAL | Status: DC | PRN

## 2015-07-08 MED ORDER — BUPIVACAINE HCL (PF) 0.25 % IJ SOLN
INTRAMUSCULAR | Status: DC | PRN
  Administered 2015-07-08: 15 mL

## 2015-07-08 MED ORDER — LACTATED RINGERS IV SOLN: INTRAVENOUS | Status: DC

## 2015-07-08 MED ORDER — LIDOCAINE HCL (CARDIAC) 20 MG/ML IV SOLN
INTRAVENOUS | Status: DC | PRN
  Administered 2015-07-08: 100 mg via INTRAVENOUS

## 2015-07-08 MED ORDER — HYDROCODONE-ACETAMINOPHEN 7.5-325 MG PO TABS: 1.0000 | ORAL_TABLET | Freq: Two times a day (BID) | ORAL | Status: DC | PRN

## 2015-07-08 MED ORDER — LIDOCAINE HCL (CARDIAC) 20 MG/ML IV SOLN
INTRAVENOUS | Status: AC
  Filled 2015-07-08: qty 5

## 2015-07-08 MED ORDER — FENTANYL CITRATE (PF) 100 MCG/2ML IJ SOLN
INTRAMUSCULAR | Status: DC | PRN
  Administered 2015-07-08 (×3): 50 ug via INTRAVENOUS

## 2015-07-08 MED ORDER — ROCURONIUM BROMIDE 100 MG/10ML IV SOLN
INTRAVENOUS | Status: DC | PRN
  Administered 2015-07-08: 50 mg via INTRAVENOUS

## 2015-07-08 MED ORDER — FENTANYL CITRATE (PF) 250 MCG/5ML IJ SOLN
INTRAMUSCULAR | Status: AC
  Filled 2015-07-08: qty 5

## 2015-07-08 MED ORDER — PROPOFOL 10 MG/ML IV BOLUS
INTRAVENOUS | Status: DC | PRN
  Administered 2015-07-08: 200 mg via INTRAVENOUS

## 2015-07-08 MED ORDER — ACETAMINOPHEN 500 MG PO TABS
1000.0000 mg | ORAL_TABLET | Freq: Once | ORAL | Status: AC
  Administered 2015-07-08: 1000 mg via ORAL
  Filled 2015-07-08: qty 2

## 2015-07-08 MED ORDER — MIDAZOLAM HCL 2 MG/2ML IJ SOLN
INTRAMUSCULAR | Status: AC
  Filled 2015-07-08: qty 2

## 2015-07-08 MED ORDER — 0.9 % SODIUM CHLORIDE (POUR BTL) OPTIME
TOPICAL | Status: DC | PRN
  Administered 2015-07-08: 1000 mL

## 2015-07-08 MED ORDER — ONDANSETRON HCL 4 MG/2ML IJ SOLN
INTRAMUSCULAR | Status: DC | PRN
  Administered 2015-07-08: 4 mg via INTRAVENOUS

## 2015-07-08 MED ORDER — BUPIVACAINE HCL (PF) 0.25 % IJ SOLN
INTRAMUSCULAR | Status: AC
  Filled 2015-07-08: qty 30

## 2015-07-08 MED ORDER — LACTATED RINGERS IV SOLN
INTRAVENOUS | Status: DC | PRN
  Administered 2015-07-08: 07:00:00 via INTRAVENOUS

## 2015-07-08 MED ORDER — SUCCINYLCHOLINE CHLORIDE 20 MG/ML IJ SOLN
INTRAMUSCULAR | Status: AC
  Filled 2015-07-08: qty 1

## 2015-07-08 SURGICAL SUPPLY — 64 items
BANDAGE ACE 6X5 VEL STRL LF (GAUZE/BANDAGES/DRESSINGS) ×2 IMPLANT
BANDAGE ELASTIC 4 VELCRO ST LF (GAUZE/BANDAGES/DRESSINGS) ×1 IMPLANT
BANDAGE ELASTIC 6 VELCRO ST LF (GAUZE/BANDAGES/DRESSINGS) ×4 IMPLANT
BANDAGE ESMARK 6X9 LF (GAUZE/BANDAGES/DRESSINGS) ×2 IMPLANT
BNDG CMPR 9X6 STRL LF SNTH (GAUZE/BANDAGES/DRESSINGS) ×2
BNDG COHESIVE 6X5 TAN STRL LF (GAUZE/BANDAGES/DRESSINGS) ×4 IMPLANT
BNDG ESMARK 6X9 LF (GAUZE/BANDAGES/DRESSINGS) ×4
BNDG GAUZE ELAST 4 BULKY (GAUZE/BANDAGES/DRESSINGS) ×5 IMPLANT
BRUSH SCRUB DISP (MISCELLANEOUS) ×12 IMPLANT
CLEANER TIP ELECTROSURG 2X2 (MISCELLANEOUS) ×4 IMPLANT
CLOSURE WOUND 1/2 X4 (GAUZE/BANDAGES/DRESSINGS)
COVER SURGICAL LIGHT HANDLE (MISCELLANEOUS) ×5 IMPLANT
CUFF TOURNIQUET SINGLE 18IN (TOURNIQUET CUFF) IMPLANT
CUFF TOURNIQUET SINGLE 24IN (TOURNIQUET CUFF) IMPLANT
CUFF TOURNIQUET SINGLE 34IN LL (TOURNIQUET CUFF) IMPLANT
DRAPE C-ARM 42X72 X-RAY (DRAPES) ×2 IMPLANT
DRAPE C-ARMOR (DRAPES) ×2 IMPLANT
DRAPE OEC MINIVIEW 54X84 (DRAPES) ×4 IMPLANT
DRAPE U-SHAPE 47X51 STRL (DRAPES) ×4 IMPLANT
DRSG ADAPTIC 3X8 NADH LF (GAUZE/BANDAGES/DRESSINGS) ×4 IMPLANT
ELECT REM PT RETURN 9FT ADLT (ELECTROSURGICAL) ×4
ELECTRODE REM PT RTRN 9FT ADLT (ELECTROSURGICAL) ×2 IMPLANT
EVACUATOR 1/8 PVC DRAIN (DRAIN) IMPLANT
GAUZE SPONGE 4X4 12PLY STRL (GAUZE/BANDAGES/DRESSINGS) ×4 IMPLANT
GLOVE BIO SURGEON STRL SZ7.5 (GLOVE) ×4 IMPLANT
GLOVE BIO SURGEON STRL SZ8 (GLOVE) ×4 IMPLANT
GLOVE BIOGEL PI IND STRL 7.5 (GLOVE) ×2 IMPLANT
GLOVE BIOGEL PI IND STRL 8 (GLOVE) ×2 IMPLANT
GLOVE BIOGEL PI INDICATOR 7.5 (GLOVE) ×2
GLOVE BIOGEL PI INDICATOR 8 (GLOVE) ×2
GOWN STRL REUS W/ TWL LRG LVL3 (GOWN DISPOSABLE) ×4 IMPLANT
GOWN STRL REUS W/ TWL XL LVL3 (GOWN DISPOSABLE) ×2 IMPLANT
GOWN STRL REUS W/TWL LRG LVL3 (GOWN DISPOSABLE) ×8
GOWN STRL REUS W/TWL XL LVL3 (GOWN DISPOSABLE) ×4
KIT BASIN OR (CUSTOM PROCEDURE TRAY) ×4 IMPLANT
KIT ROOM TURNOVER OR (KITS) ×4 IMPLANT
MANIFOLD NEPTUNE II (INSTRUMENTS) ×1 IMPLANT
NDL 18GX1X1/2 (RX/OR ONLY) (NEEDLE) IMPLANT
NEEDLE 18GX1X1/2 (RX/OR ONLY) (NEEDLE) ×4 IMPLANT
NEEDLE 22X1 1/2 (OR ONLY) (NEEDLE) ×2 IMPLANT
NS IRRIG 1000ML POUR BTL (IV SOLUTION) ×4 IMPLANT
PACK ORTHO EXTREMITY (CUSTOM PROCEDURE TRAY) ×4 IMPLANT
PAD ARMBOARD 7.5X6 YLW CONV (MISCELLANEOUS) ×8 IMPLANT
PADDING CAST COTTON 6X4 STRL (CAST SUPPLIES) ×6 IMPLANT
SPONGE LAP 18X18 X RAY DECT (DISPOSABLE) ×2 IMPLANT
SPONGE SCRUB IODOPHOR (GAUZE/BANDAGES/DRESSINGS) ×2 IMPLANT
STAPLER VISISTAT 35W (STAPLE) IMPLANT
STOCKINETTE IMPERVIOUS LG (DRAPES) ×4 IMPLANT
STRIP CLOSURE SKIN 1/2X4 (GAUZE/BANDAGES/DRESSINGS) IMPLANT
SUCTION FRAZIER TIP 10 FR DISP (SUCTIONS) IMPLANT
SUT ETHILON 3 0 PS 1 (SUTURE) IMPLANT
SUT PDS AB 2-0 CT1 27 (SUTURE) IMPLANT
SUT VIC AB 0 CT1 27 (SUTURE)
SUT VIC AB 0 CT1 27XBRD ANBCTR (SUTURE) IMPLANT
SUT VIC AB 2-0 CT1 27 (SUTURE)
SUT VIC AB 2-0 CT1 TAPERPNT 27 (SUTURE) IMPLANT
SYR CONTROL 10ML LL (SYRINGE) ×4 IMPLANT
TOWEL OR 17X24 6PK STRL BLUE (TOWEL DISPOSABLE) ×8 IMPLANT
TOWEL OR 17X26 10 PK STRL BLUE (TOWEL DISPOSABLE) ×8 IMPLANT
TUBE CONNECTING 12'X1/4 (SUCTIONS) ×1
TUBE CONNECTING 12X1/4 (SUCTIONS) ×3 IMPLANT
UNDERPAD 30X30 INCONTINENT (UNDERPADS AND DIAPERS) ×4 IMPLANT
WATER STERILE IRR 1000ML POUR (IV SOLUTION) ×8 IMPLANT
YANKAUER SUCT BULB TIP NO VENT (SUCTIONS) ×4 IMPLANT

## 2015-07-08 NOTE — Anesthesia Postprocedure Evaluation (Signed)
Anesthesia Post Note  Patient: Lance Cline  Procedure(s) Performed: Procedure(s) (LRB): HARDWARE REMOVAL LEFT FEMUR (Left) CLOSED MANIPULATION LEFT KNEE (Left)  Patient location during evaluation: PACU Anesthesia Type: General Level of consciousness: awake and alert Pain management: pain level controlled Vital Signs Assessment: post-procedure vital signs reviewed and stable Respiratory status: spontaneous breathing, nonlabored ventilation, respiratory function stable and patient connected to nasal cannula oxygen Cardiovascular status: blood pressure returned to baseline and stable Postop Assessment: no signs of nausea or vomiting Anesthetic complications: no    Last Vitals:  Filed Vitals:   07/08/15 1013 07/08/15 1016  BP:  142/99  Pulse:  82  Temp: 36.7 C   Resp:      Last Pain:  Filed Vitals:   07/08/15 1053  PainSc: 0-No pain                 Reino Kent

## 2015-07-08 NOTE — Progress Notes (Signed)
Pt wants to drive himself home. S/P general anesthesia. Explained to pt that this is not acceptable. Pt verbalizes understanding &  is in the process of contacting someone to come to the hospital.
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
## 2015-07-08 NOTE — Brief Op Note (Signed)
07/08/2015  9:24 AM  PATIENT:  Lance Cline  40 y.o. male  PRE-OPERATIVE DIAGNOSIS:   1. SYMPTOMATIC HARDWARE LEFT FEMUR 2. LEFT KNEE CONTRACTURE  POST-OPERATIVE DIAGNOSIS:   1. SYMPTOMATIC HARDWARE LEFT FEMUR 2. LEFT KNEE CONTRACTURE  PROCEDURE:  Procedure(s): 1. HARDWARE REMOVAL LEFT FEMUR (Left) 2. CLOSED MANIPULATION LEFT KNEE (Left), INCREASING FLEXION FROM 90 TO 125 DEGREES 3. ASPIRATION AND INJECTION OF LEFT KNEE  SURGEON:  Surgeon(s) and Role:    * Myrene Galas, MD - Primary  PHYSICIAN ASSISTANT: NONE  ANESTHESIA:   general  I/O:  Total I/O In: 600 [I.V.:600] Out: -   SPECIMEN:  No Specimen  TOURNIQUET:  * No tourniquets in log *  DICTATION: .Other Dictation: Dictation Number [id number]
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
## 2015-07-08 NOTE — Discharge Instructions (Signed)
Orthopaedic Trauma Service Discharge Instructions,   General Discharge Instructions  WEIGHT BEARING STATUS: weightbearing as tolerated  RANGE OF MOTION/ACTIVITY:range of motion as tolerated, no restrictions   Wound Care: daily wound care starting 07/11/2015  Discharge Wound Care Instructions  Do NOT apply any ointments, solutions or lotions to pin sites or surgical wounds.  These prevent needed drainage and even though solutions like hydrogen peroxide kill bacteria, they also damage cells lining the pin sites that help fight infection.  Applying lotions or ointments can keep the wounds moist and can cause them to breakdown and open up as well. This can increase the risk for infection. When in doubt call the office.  Surgical incisions should be dressed daily.  If any drainage is noted, use one layer of adaptic, then gauze, Kerlix, and an ace wrap.  Once the incision is completely dry and without drainage, it may be left open to air out.  Showering may begin 36-48 hours later.  Cleaning gently with soap and water.  Traumatic wounds should be dressed daily as well.    One layer of adaptic, gauze, Kerlix, then ace wrap.  The adaptic can be discontinued once the draining has ceased    If you have a wet to dry dressing: wet the gauze with saline the squeeze as much saline out so the gauze is moist (not soaking wet), place moistened gauze over wound, then place a dry gauze over the moist one, followed by Kerlix wrap, then ace wrap.   PAIN MEDICATION USE AND EXPECTATIONS  You have likely been given narcotic medications to help control your pain.  After a traumatic event that results in an fracture (broken bone) with or without surgery, it is ok to use narcotic pain medications to help control one's pain.  We understand that everyone responds to pain differently and each individual patient will be evaluated on a regular basis for the continued need for narcotic medications. Ideally, narcotic  medication use should last no more than 6-8 weeks (coinciding with fracture healing).   As a patient it is your responsibility as well to monitor narcotic medication use and report the amount and frequency you use these medications when you come to your office visit.   We would also advise that if you are using narcotic medications, you should take a dose prior to therapy to maximize you participation.  IF YOU ARE ON NARCOTIC MEDICATIONS IT IS NOT PERMISSIBLE TO OPERATE A MOTOR VEHICLE (MOTORCYCLE/CAR/TRUCK/MOPED) OR HEAVY MACHINERY DO NOT MIX NARCOTICS WITH OTHER CNS (CENTRAL NERVOUS SYSTEM) DEPRESSANTS SUCH AS ALCOHOL  Diet: as you were eating previously.  Can use over the counter stool softeners and bowel preparations, such as Miralax, to help with bowel movements.  Narcotics can be constipating.  Be sure to drink plenty of fluids    STOP SMOKING OR USING NICOTINE PRODUCTS!!!!  As discussed nicotine severely impairs your body's ability to heal surgical and traumatic wounds but also impairs bone healing.  Wounds and bone heal by forming microscopic blood vessels (angiogenesis) and nicotine is a vasoconstrictor (essentially, shrinks blood vessels).  Therefore, if vasoconstriction occurs to these microscopic blood vessels they essentially disappear and are unable to deliver necessary nutrients to the healing tissue.  This is one modifiable factor that you can do to dramatically increase your chances of healing your injury.    (This means no smoking, no nicotine gum, patches, etc)  DO NOT USE NONSTEROIDAL ANTI-INFLAMMATORY DRUGS (NSAID'S)  Using products such as Advil (ibuprofen), Aleve (naproxen),  Motrin (ibuprofen) for additional pain control during fracture healing can delay and/or prevent the healing response.  If you would like to take over the counter (OTC) medication, Tylenol (acetaminophen) is ok.  However, some narcotic medications that are given for pain control contain acetaminophen as  well. Therefore, you should not exceed more than 4000 mg of tylenol in a day if you do not have liver disease.  Also note that there are may OTC medicines, such as cold medicines and allergy medicines that my contain tylenol as well.  If you have any questions about medications and/or interactions please ask your doctor/PA or your pharmacist.      ICE AND ELEVATE INJURED/OPERATIVE EXTREMITY  Using ice and elevating the injured extremity above your heart can help with swelling and pain control.  Icing in a pulsatile fashion, such as 20 minutes on and 20 minutes off, can be followed.    Do not place ice directly on skin. Make sure there is a barrier between to skin and the ice pack.    Using frozen items such as frozen peas works well as the conform nicely to the are that needs to be iced.  USE AN ACE WRAP OR TED HOSE FOR SWELLING CONTROL  In addition to icing and elevation, Ace wraps or TED hose are used to help limit and resolve swelling.  It is recommended to use Ace wraps or TED hose until you are informed to stop.    When using Ace Wraps start the wrapping distally (farthest away from the body) and wrap proximally (closer to the body)   Example: If you had surgery on your leg or thing and you do not have a splint on, start the ace wrap at the toes and work your way up to the thigh        If you had surgery on your upper extremity and do not have a splint on, start the ace wrap at your fingers and work your way up to the upper arm  IF YOU ARE IN A SPLINT OR CAST DO NOT REMOVE IT FOR ANY REASON   If your splint gets wet for any reason please contact the office immediately. You may shower in your splint or cast as long as you keep it dry.  This can be done by wrapping in a cast cover or garbage back (or similar)  Do Not stick any thing down your splint or cast such as pencils, money, or hangers to try and scratch yourself with.  If you feel itchy take benadryl as prescribed on the bottle for  itching  IF YOU ARE IN A CAM BOOT (BLACK BOOT)  You may remove boot periodically. Perform daily dressing changes as noted below.  Wash the liner of the boot regularly and wear a sock when wearing the boot. It is recommended that you sleep in the boot until told otherwise  CALL THE OFFICE WITH ANY QUESTIONS OR CONCERTS: [phone]

## 2015-07-08 NOTE — Progress Notes (Signed)
Patient was signed out by sons girlfriend. She states she will take him home and not allow him to drive. Also when he gets home the patient's girlfriend will take over care. Discharge instructions and Rx was sent home with son's girlfriend.

## 2015-07-08 NOTE — Anesthesia Procedure Notes (Signed)
Procedure Name: Intubation Date/Time: 07/08/2015 8:28 AM Performed by: Izora Gala Pre-anesthesia Checklist: Patient identified, Emergency Drugs available, Suction available and Patient being monitored Patient Re-evaluated:Patient Re-evaluated prior to inductionOxygen Delivery Method: Circle system utilized Preoxygenation: Pre-oxygenation with 100% oxygen Intubation Type: IV induction Ventilation: Mask ventilation without difficulty Laryngoscope Size: Miller and 3 Grade View: Grade II Tube type: Oral Tube size: 7.5 mm Number of attempts: 1 Airway Equipment and Method: Stylet and LTA kit utilized Placement Confirmation: ETT inserted through vocal cords under direct vision,  positive ETCO2 and breath sounds checked- equal and bilateral Secured at: 22 cm Tube secured with: Tape

## 2015-07-08 NOTE — Transfer of Care (Signed)
Immediate Anesthesia Transfer of Care Note  Patient: Lance Cline  Procedure(s) Performed: Procedure(s): HARDWARE REMOVAL LEFT FEMUR (Left) CLOSED MANIPULATION LEFT KNEE (Left)  Patient Location: PACU  Anesthesia Type:General  Level of Consciousness: awake, alert , oriented and patient cooperative  Airway & Oxygen Therapy: Patient Spontanous Breathing and Patient connected to nasal cannula oxygen  Post-op Assessment: Report given to RN, Post -op Vital signs reviewed and stable, Patient moving all extremities and Patient moving all extremities X 4  Post vital signs: Reviewed and stable  Last Vitals:  Filed Vitals:   07/08/15 0637  BP: 151/108  Pulse: 85  Temp: 36.8 C  Resp: 20    Complications: No apparent anesthesia complications

## 2015-07-09 ENCOUNTER — Encounter (HOSPITAL_COMMUNITY): Payer: Self-pay | Admitting: Orthopedic Surgery

## 2015-07-24 ENCOUNTER — Encounter (HOSPITAL_COMMUNITY): Payer: Self-pay | Admitting: Orthopedic Surgery

## 2015-07-24 NOTE — Addendum Note (Signed)
Addendum  created 07/24/15 1556 by Karlyne Greenspan, MD   Modules edited: Anesthesia Events

## 2015-10-04 ENCOUNTER — Encounter: Payer: Self-pay | Admitting: Physician Assistant

## 2015-10-04 ENCOUNTER — Ambulatory Visit (INDEPENDENT_AMBULATORY_CARE_PROVIDER_SITE_OTHER): Payer: Self-pay | Admitting: Physician Assistant

## 2015-10-04 DIAGNOSIS — F32A Depression, unspecified: Secondary | ICD-10-CM

## 2015-10-04 DIAGNOSIS — N529 Male erectile dysfunction, unspecified: Secondary | ICD-10-CM

## 2015-10-04 DIAGNOSIS — F329 Major depressive disorder, single episode, unspecified: Secondary | ICD-10-CM

## 2015-10-04 MED ORDER — TADALAFIL 20 MG PO TABS: 20.0000 mg | ORAL_TABLET | Freq: Every day | ORAL | Status: DC | PRN

## 2015-10-04 MED ORDER — CITALOPRAM HYDROBROMIDE 20 MG PO TABS: 40.0000 mg | ORAL_TABLET | Freq: Every day | ORAL | Status: DC
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
## 2015-10-04 NOTE — Patient Instructions (Signed)
     IF you received an x-ray today, you will receive an invoice from [facility]. Please contact [facility] at [phone] with questions or concerns regarding your invoice.   IF you received labwork today, you will receive an invoice from [facility]. Please contact [facility] at [phone] with questions or concerns regarding your invoice.   Our billing staff will not be able to assist you with questions regarding bills from these companies.  You will be contacted with the lab results as soon as they are available. The fastest way to get your results is to activate your My Chart account. Instructions are located on the last page of this paperwork. If you have not heard from us regarding the results in 2 weeks, please contact this office.      
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
## 2017-03-14 ENCOUNTER — Ambulatory Visit (INDEPENDENT_AMBULATORY_CARE_PROVIDER_SITE_OTHER): Payer: Self-pay | Admitting: Physician Assistant

## 2017-03-14 ENCOUNTER — Encounter: Payer: Self-pay | Admitting: Physician Assistant

## 2017-03-14 ENCOUNTER — Telehealth: Payer: Self-pay | Admitting: *Deleted

## 2017-03-14 VITALS — BP 126/92 | HR 77 | Temp 98.5°F | Resp 16 | Ht 70.0 in | Wt 213.2 lb

## 2017-03-14 DIAGNOSIS — N529 Male erectile dysfunction, unspecified: Secondary | ICD-10-CM

## 2017-03-14 DIAGNOSIS — Z760 Encounter for issue of repeat prescription: Secondary | ICD-10-CM

## 2017-03-14 DIAGNOSIS — F329 Major depressive disorder, single episode, unspecified: Secondary | ICD-10-CM

## 2017-03-14 DIAGNOSIS — F32A Depression, unspecified: Secondary | ICD-10-CM

## 2017-03-14 MED ORDER — TADALAFIL 20 MG PO TABS: 20.0000 mg | ORAL_TABLET | Freq: Every day | ORAL | 0 refills | Status: DC | PRN

## 2017-03-14 MED ORDER — CITALOPRAM HYDROBROMIDE 20 MG PO TABS
40.0000 mg | ORAL_TABLET | Freq: Every day | ORAL | 1 refills | Status: DC
Start: 2017-03-14 — End: 2018-12-12

## 2017-03-14 MED ORDER — CITALOPRAM HYDROBROMIDE 20 MG PO TABS: 40.0000 mg | ORAL_TABLET | Freq: Every day | ORAL | 1 refills | Status: DC
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
## 2017-03-14 NOTE — Telephone Encounter (Signed)
Called [facility] to cancel Cialis 20 mg and Citalopram.

## 2017-03-14 NOTE — Patient Instructions (Signed)
     IF you received an x-ray today, you will receive an invoice from [facility]. Please contact [facility] at [phone] with questions or concerns regarding your invoice.   IF you received labwork today, you will receive an invoice from [facility]. Please contact [facility] at [phone] with questions or concerns regarding your invoice.   Our billing staff will not be able to assist you with questions regarding bills from these companies.  You will be contacted with the lab results as soon as they are available. The fastest way to get your results is to activate your My Chart account. Instructions are located on the last page of this paperwork. If you have not heard from us regarding the results in 2 weeks, please contact this office.     

## 2017-09-27 ENCOUNTER — Other Ambulatory Visit: Payer: Self-pay | Admitting: Physician Assistant

## 2017-09-27 DIAGNOSIS — N529 Male erectile dysfunction, unspecified: Secondary | ICD-10-CM

## 2017-09-27 NOTE — Telephone Encounter (Signed)
Refill  Request Cialis 20 mg  LOV 03/14/2017 Pharmacy on file

## 2017-09-30 NOTE — Telephone Encounter (Signed)
Patient called this in on 09/27/17 but the message was apparently not routed anywhere so I am marking high priority so that the patient get his a call back.

## 2017-09-30 NOTE — Telephone Encounter (Signed)
Pt calling to check status on getting his med refilled. Cialis 20mg 

## 2017-10-03 NOTE — Telephone Encounter (Signed)
Patient last office visit was 03/14/2017

## 2017-10-04 NOTE — Telephone Encounter (Signed)
Please call pt and let him know Rx for cialis is at pharmacy. Thank you!

## 2017-10-04 NOTE — Telephone Encounter (Signed)
Incoming call from VevayNorma at call center. Patient states he has been waiting on this medication since 09/27/17. Nelva Bushorma advised medication is awaiting provider signature/denial.

## 2017-10-04 NOTE — Telephone Encounter (Signed)
Pt would like a callback once rx has been sent to pharm. Please call patient [phone]

## 2017-10-05 ENCOUNTER — Other Ambulatory Visit: Payer: Self-pay | Admitting: *Deleted

## 2017-10-05 DIAGNOSIS — N529 Male erectile dysfunction, unspecified: Secondary | ICD-10-CM

## 2017-10-05 MED ORDER — TADALAFIL 20 MG PO TABS: ORAL_TABLET | ORAL | 0 refills | Status: DC

## 2017-11-25 ENCOUNTER — Encounter: Payer: Self-pay | Admitting: Physician Assistant

## 2017-11-25 ENCOUNTER — Other Ambulatory Visit: Payer: Self-pay

## 2017-11-25 ENCOUNTER — Ambulatory Visit (INDEPENDENT_AMBULATORY_CARE_PROVIDER_SITE_OTHER): Payer: PRIVATE HEALTH INSURANCE | Admitting: Physician Assistant

## 2017-11-25 VITALS — BP 134/96 | HR 72 | Temp 98.5°F | Resp 16 | Ht 69.5 in | Wt 220.0 lb

## 2017-11-25 DIAGNOSIS — N529 Male erectile dysfunction, unspecified: Secondary | ICD-10-CM | POA: Diagnosis not present

## 2017-11-25 DIAGNOSIS — R0683 Snoring: Secondary | ICD-10-CM | POA: Diagnosis not present

## 2017-11-25 MED ORDER — TADALAFIL 20 MG PO TABS: ORAL_TABLET | ORAL | 5 refills | Status: DC

## 2017-11-25 NOTE — Patient Instructions (Addendum)
You will receive a phone call to schedule an appointment for a sleep study.   Sleep Apnea Sleep apnea is a condition in which breathing pauses or becomes shallow during sleep. Episodes of sleep apnea usually last 10 seconds or longer, and they may occur as many as 20 times an hour. Sleep apnea disrupts your sleep and keeps your body from getting the rest that it needs. This condition can increase your risk of certain health problems, including:  Heart attack.  Stroke.  Obesity.  Diabetes.  Heart failure.  Irregular heartbeat.  There are three kinds of sleep apnea:  Obstructive sleep apnea. This kind is caused by a blocked or collapsed airway.  Central sleep apnea. This kind happens when the part of the brain that controls breathing does not send the correct signals to the muscles that control breathing.  Mixed sleep apnea. This is a combination of obstructive and central sleep apnea.  What are the causes? The most common cause of this condition is a collapsed or blocked airway. An airway can collapse or become blocked if:  Your throat muscles are abnormally relaxed.  Your tongue and tonsils are larger than normal.  You are overweight.  Your airway is smaller than normal.  What increases the risk? This condition is more likely to develop in people who:  Are overweight.  Smoke.  Have a smaller than normal airway.  Are elderly.  Are male.  Drink alcohol.  Take sedatives or tranquilizers.  Have a family history of sleep apnea.  What are the signs or symptoms? Symptoms of this condition include:  Trouble staying asleep.  Daytime sleepiness and tiredness.  Irritability.  Loud snoring.  Morning headaches.  Trouble concentrating.  Forgetfulness.  Decreased interest in sex.  Unexplained sleepiness.  Mood swings.  Personality changes.  Feelings of depression.  Waking up often during the night to urinate.  Dry mouth.  Sore throat.  How is  this diagnosed? This condition may be diagnosed with:  A medical history.  A physical exam.  A series of tests that are done while you are sleeping (sleep study). These tests are usually done in a sleep lab, but they may also be done at home.  How is this treated? Treatment for this condition aims to restore normal breathing and to ease symptoms during sleep. It may involve managing health issues that can affect breathing, such as high blood pressure or obesity. Treatment may include:  Sleeping on your side.  Using a decongestant if you have nasal congestion.  Avoiding the use of depressants, including alcohol, sedatives, and narcotics.  Losing weight if you are overweight.  Making changes to your diet.  Quitting smoking.  Using a device to open your airway while you sleep, such as: ? An oral appliance. This is a custom-made mouthpiece that shifts your lower jaw forward. ? A continuous positive airway pressure (CPAP) device. This device delivers oxygen to your airway through a mask. ? A nasal expiratory positive airway pressure (EPAP) device. This device has valves that you put into each nostril. ? A bi-level positive airway pressure (BPAP) device. This device delivers oxygen to your airway through a mask.  Surgery if other treatments do not work. During surgery, excess tissue is removed to create a wider airway.  It is important to get treatment for sleep apnea. Without treatment, this condition can lead to:  High blood pressure.  Coronary artery disease.  (Men) An inability to achieve or maintain an erection (impotence).  Reduced  thinking abilities.  Follow these instructions at home:  Make any lifestyle changes that your health care provider recommends.  Eat a healthy, well-balanced diet.  Take over-the-counter and prescription medicines only as told by your health care provider.  Avoid using depressants, including alcohol, sedatives, and narcotics.  Take steps  to lose weight if you are overweight.  If you were given a device to open your airway while you sleep, use it only as told by your health care provider.  Do not use any tobacco products, such as cigarettes, chewing tobacco, and e-cigarettes. If you need help quitting, ask your health care provider.  Keep all follow-up visits as told by your health care provider. This is important. Contact a health care provider if:  The device that you received to open your airway during sleep is uncomfortable or does not seem to be working.  Your symptoms do not improve.  Your symptoms get worse. Get help right away if:  You develop chest pain.  You develop shortness of breath.  You develop discomfort in your back, arms, or stomach.  You have trouble speaking.  You have weakness on one side of your body.  You have drooping in your face. These symptoms may represent a serious problem that is an emergency. Do not wait to see if the symptoms will go away. Get medical help right away. Call your local emergency services (911 in the [place] Do not drive yourself to the hospital. This information is not intended to replace advice given to you by your health care provider. Make sure you discuss any questions you have with your health care provider. Document Released: 05/28/2002 Document Revised: 02/01/2016 Document Reviewed: 03/17/2015 Elsevier Interactive Patient Education  2018 [employer]   IF you received an x-ray today, you will receive an invoice from [facility]. Please contact [facility] at [phone] with questions or concerns regarding your invoice.   IF you received labwork today, you will receive an invoice from [facility]. Please contact [facility] at [phone] with questions or concerns regarding your invoice.   Our billing staff will not be able to assist you with questions regarding bills from these companies.  You will be contacted with the lab results as soon as  they are available. The fastest way to get your results is to activate your My Chart account. Instructions are located on the last page of this paperwork. If you have not heard from us regarding the results in 2 weeks, please contact this office.
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
## 2018-07-10 ENCOUNTER — Other Ambulatory Visit: Payer: Self-pay | Admitting: Physician Assistant

## 2018-07-10 DIAGNOSIS — N529 Male erectile dysfunction, unspecified: Secondary | ICD-10-CM

## 2018-07-10 NOTE — Telephone Encounter (Signed)
Requested Prescriptions  Pending Prescriptions Disp Refills  . tadalafil (ADCIRCA/CIALIS) 20 MG tablet [Pharmacy Med Name: TADALAFIL 20 MG TABLET] 15 tablet 4    Sig: TAKE ONE TABLET BY MOUTH DAILY AS NEEDED FOR ERECTILE DYSFUNCTION     Urology: Erectile Dysfunction Agents Failed - 07/10/2018  3:16 PM      Failed - Last BP in normal range    BP Readings from Last 1 Encounters:  11/29/14 125/85         Passed - Valid encounter within last 12 months    Recent Outpatient Visits          7 months ago Snoring   Primary Care at [facility], Madelaine Bhat, PA-C   1 year ago Depression, unspecified depression type   Primary Care at [facility], Madelaine Bhat, PA-C   2 years ago Depression   Primary Care at [place], [place], [place]   4 years ago Exposure to trichomonas   [facility] at [facility], Dema Severin, PA-C   5 years ago Depression with anxiety   [facility] at [facility], Dema Severin, PA-C

## 2018-12-12 ENCOUNTER — Ambulatory Visit: Payer: Self-pay | Admitting: Family Medicine

## 2018-12-12 ENCOUNTER — Other Ambulatory Visit: Payer: Self-pay

## 2018-12-12 ENCOUNTER — Encounter: Payer: Self-pay | Admitting: Family Medicine

## 2018-12-12 VITALS — BP 138/90 | HR 98 | Temp 98.5°F | Ht 70.0 in | Wt 201.0 lb

## 2018-12-12 DIAGNOSIS — F330 Major depressive disorder, recurrent, mild: Secondary | ICD-10-CM

## 2018-12-12 DIAGNOSIS — N529 Male erectile dysfunction, unspecified: Secondary | ICD-10-CM | POA: Insufficient documentation

## 2018-12-12 DIAGNOSIS — F32A Depression, unspecified: Secondary | ICD-10-CM

## 2018-12-12 DIAGNOSIS — F329 Major depressive disorder, single episode, unspecified: Secondary | ICD-10-CM

## 2018-12-12 MED ORDER — CITALOPRAM HYDROBROMIDE 20 MG PO TABS: 40.0000 mg | ORAL_TABLET | Freq: Every day | ORAL | 1 refills | Status: AC

## 2018-12-12 MED ORDER — TADALAFIL 20 MG PO TABS: ORAL_TABLET | ORAL | 4 refills | Status: DC

## 2018-12-12 MED ORDER — TADALAFIL 20 MG PO TABS: ORAL_TABLET | ORAL | 5 refills | Status: DC

## 2018-12-12 NOTE — Patient Instructions (Addendum)
  Pick up medication for depression-consider taking daily at the same time for best effect of the medication.   Return to clinic for diabetic testing -to include labwork, urinalysis   If you have lab work done today you will be contacted with your lab results within the next 2 weeks.  If you have not heard from [place] then please contact us. The fastest way to get your results is to register for My Chart. Pt to schedule for follow up appt to discuss DM and obtain blood work needed to assess his diabetic status. Pt declined today.    IF you received an x-ray today, you will receive an invoice from [facility]. Please contact [facility] at [phone] with questions or concerns regarding your invoice.   IF you received labwork today, you will receive an invoice from [facility]. Please contact [facility] at [phone] with questions or concerns regarding your invoice.   Our billing staff will not be able to assist you with questions regarding bills from these companies.  You will be contacted with the lab results as soon as they are available. The fastest way to get your results is to activate your My Chart account. Instructions are located on the last page of this paperwork. If you have not heard from [place] regarding the results in 2 weeks, please contact this office.

## 2018-12-12 NOTE — Progress Notes (Signed)
Acute Office Visit  Subjective:    Patient ID: Lance Cline, male    DOB: 1976/02/09, 43 y.o.   MRN: [id number]  Chief Complaint  Patient presents with  . Medication Refill    celexa and cialis  . Diabetes    not financially able to pay for all the blood work needed. Wanting to get started on diabetes medication    HPI Patient is in today for celexa refill -pt with anxiety and depression in the past-takes he usually takes medication as needed for anxiety and depression 1-2 weeks at a time until symptoms pass. Pt states he has never taken medication on a regular basis. Pt with ED-uses cialis -no h/o of chest tightness. Pt states he is aware of the risk of the medication. Pt with diagnosis of DM but does not wish to have labwork or be seen for this diagnosis today. Pt states he will have insurance in a few weeks and would like to wait on testing until insurance is in effect. Pt states he would like sleep specialist referral-pt concerned about snoring  Past Medical History:  Diagnosis Date  . Anxiety   . Depression   . Medical history non-contributory     Past Surgical History:  Procedure Laterality Date  . APPLICATION OF WOUND VAC Left 11/11/2014   Procedure: LEFT THIGH APPLICATION OF WOUND VAC ;  Surgeon: Myrene GalasMichael Handy, MD;  [facility];  Service: Orthopedics;  Laterality: Left;  . FEMUR IM NAIL Bilateral 10/26/2014   Procedure: LEFT AND RIGHT IM NAIL;  Surgeon: Tarry KosNaiping M Xu, MD;  [facility]: [facility] OR;  Service: Orthopedics;  Laterality: Bilateral;  . HARDWARE REMOVAL Left 07/08/2015   Procedure: HARDWARE REMOVAL LEFT FEMUR;  Surgeon: Myrene GalasMichael Handy, MD;  [facility];  Service: Orthopedics;  Laterality: Left;  . I&D EXTREMITY Left 10/28/2014   Procedure: IRRIGATION AND DEBRIDEMENT EXTREMITY WITH VAC;  Surgeon: Myrene GalasMichael Handy, MD;  [facility];  Service: Orthopedics;  Laterality: Left;  . I&D EXTREMITY Left 10/31/2014   Procedure: IRRIGATION AND DEBRIDEMENT EXTREMITY;  Surgeon:  Myrene GalasMichael Handy, MD;  [facility];  Service: Orthopedics;  Laterality: Left;  . I&D EXTREMITY Left 11/04/2014   Procedure: IRRIGATION AND DEBRIDEMENT LEFT LEG;  Surgeon: Myrene GalasMichael Handy, MD;  [facility];  Service: Orthopedics;  Laterality: Left;  . I&D EXTREMITY Left 11/11/2014   Procedure: IRRIGATION AND DEBRIDEMENT LEFT THIGH;  Surgeon: Myrene GalasMichael Handy, MD;  [facility];  Service: Orthopedics;  Laterality: Left;  . I&D EXTREMITY Left 11/14/2014   Procedure: IRRIGATION AND DEBRIDEMENT EXTREMITY;  Surgeon: Myrene GalasMichael Handy, MD;  [facility];  Service: Orthopedics;  Laterality: Left;  . I&D EXTREMITY Left 11/07/2014   Procedure: IRRIGATION AND DEBRIDEMENT LEFT LEG;  Surgeon: Myrene GalasMichael Handy, MD;  [facility];  Service: Orthopedics;  Laterality: Left;  . INCISION AND DRAINAGE Left 10/26/2014   Procedure: INCISION AND DRAINAGE OF LEFT HIP/THIGH WOUND;  Surgeon: Tarry KosNaiping M Xu, MD;  [facility];  Service: Orthopedics;  Laterality: Left;  . KNEE CLOSED REDUCTION Left 07/08/2015   Procedure: CLOSED MANIPULATION LEFT KNEE;  Surgeon: Myrene GalasMichael Handy, MD;  [facility];  Service: Orthopedics;  Laterality: Left;  . ORIF ACETABULAR FRACTURE Left 11/07/2014   Procedure: OPEN REDUCTION INTERNAL FIXATION (ORIF) ACETABULAR FRACTURE;  Surgeon: Myrene GalasMichael Handy, MD;  [facility];  Service: Orthopedics;  Laterality: Left;  . SKIN SPLIT GRAFT Left 11/14/2014   Procedure: SKIN GRAFT SPLIT THICKNESS;  Surgeon: Myrene GalasMichael Handy, MD;  [facility];  Service: Orthopedics;  Laterality: Left;    No family history on file.  Social History   Socioeconomic History  . Marital status: Single    Spouse name: Not on file  . Number of children: Not on file  . Years of education: Not on file  . Highest education level: Not on file  Occupational History  . Not on file  Social Needs  . Financial resource strain: Not on file  . Food insecurity    Worry: Not on file    Inability: Not on file  . Transportation  needs    Medical: Not on file    Non-medical: Not on file  Tobacco Use  . Smoking status: Current Some Day Smoker    Packs/day: 0.00    Years: 19.00    Pack years: 0.00    Types: Cigarettes  . Smokeless tobacco: Never Used  Substance and Sexual Activity  . Alcohol use: Yes    Comment: rare  . Drug use: No  . Sexual activity: Yes  Lifestyle  . Physical activity    Days per week: Not on file    Minutes per session: Not on file  . Stress: Not on file  Relationships  . Social Musicianconnections    Talks on phone: Not on file    Gets together: Not on file    Attends religious service: Not on file    Active member of club or organization: Not on file    Attends meetings of clubs or organizations: Not on file    Relationship status: Not on file  . Intimate partner violence    Fear of current or ex partner: Not on file    Emotionally abused: Not on file    Physically abused: Not on file    Forced sexual activity: Not on file  Other Topics Concern  . Not on file  Social History Narrative   ** Merged History Encounter **        Outpatient Medications Prior to Visit  Medication Sig Dispense Refill  . citalopram (CELEXA) 20 MG tablet Take 2 tablets (40 mg total) by mouth daily. 180 tablet 1  . tadalafil (ADCIRCA/CIALIS) 20 MG tablet TAKE ONE TABLET BY MOUTH DAILY AS NEEDED FOR ERECTILE DYSFUNCTION 15 tablet 4   No facility-administered medications prior to visit.     No Known Allergies  Review of Systems  Constitutional: Negative for fever and malaise/fatigue.  Cardiovascular: Negative for chest pain and palpitations.  Psychiatric/Behavioral: Positive for depression. The patient is nervous/anxious.        Objective:    Physical Exam  Constitutional: He appears well-developed and well-nourished. No distress.  Cardiovascular: Normal rate, regular rhythm and normal heart sounds.  Pulmonary/Chest: Effort normal and breath sounds normal.  Neurological: He is alert.  Skin: He  is not diaphoretic.    BP 138/90 (BP Location: Left Arm, Patient Position: Sitting, Cuff Size: Large)   Pulse 98   Temp 98.5 F (36.9 C) (Oral)   Ht 5\' 10"  (1.778 m)   Wt 201 lb (91.2 kg)   SpO2 98%   BMI 28.84 kg/m  Wt Readings from Last 3 Encounters:  12/12/18 201 lb (91.2 kg)  11/27/14 207 lb 10.8 oz (94.2 kg)  11/07/14 224 lb 10.4 oz (101.9 kg)    Health Maintenance Due  Topic Date Due  . PNEUMOCOCCAL POLYSACCHARIDE VACCINE AGE 9-64 HIGH RISK  07/06/1975  . FOOT EXAM  07/06/1983  . OPHTHALMOLOGY EXAM  07/06/1983  .  URINE MICROALBUMIN  07/06/1983  . HEMOGLOBIN A1C  04/29/2015      No results found for: TSH Lab Results  Component Value Date   WBC 7.1 07/08/2015   HGB 15.1 07/08/2015   HCT 43.4 07/08/2015   MCV 85.4 07/08/2015   PLT 280 07/08/2015   Lab Results  Component Value Date   NA 133 (L) 11/21/2014   K 4.1 11/21/2014   CO2 26 11/21/2014   GLUCOSE 133 (H) 11/21/2014   BUN <5 (L) 11/21/2014   CREATININE 0.78 11/21/2014   BILITOT 0.3 11/21/2014   ALKPHOS 300 (H) 11/21/2014   AST 45 (H) 11/21/2014   ALT 44 11/21/2014   PROT 7.4 11/21/2014   ALBUMIN 2.0 (L) 11/21/2014   CALCIUM 8.3 (L) 11/21/2014   ANIONGAP 8 11/21/2014   Lab Results  Component Value Date   HGBA1C 7.5 (H) 10/27/2014       Assessment & Plan:   Problem List Items Addressed This Visit    None     1. Mild episode of recurrent major depressive disorder ([place]) MMS 12-pt would like to restart medication-discussed with pt at length importance of taking the medication daily as opposed to as needed-risk/benefit/side effects d/w pt  2. Depression, unspecified depression type - citalopram (CELEXA) 20 MG tablet; Take 2 tablets (40 mg total) by mouth daily.  Dispense: 180 tablet; Refill: 1 F/u 1 month to recheck and complete testing for DM 3. Erectile dysfunction, unspecified erectile dysfunction type Risk /side effects d/w pt-pt has been taking long term and is not concerned about  potential vascular risk - tadalafil (CIALIS) 20 MG tablet; TAKE ONE TABLET BY MOUTH DAILY AS NEEDED FOR ERECTILE DYSFUNCTION  Dispense: 30 tablet; Refill: 5   LISA Hannah Beat, MD

## 2019-05-07 ENCOUNTER — Other Ambulatory Visit: Payer: Self-pay | Admitting: Family Medicine

## 2019-05-07 DIAGNOSIS — N529 Male erectile dysfunction, unspecified: Secondary | ICD-10-CM

## 2019-05-07 MED ORDER — TADALAFIL 20 MG PO TABS: ORAL_TABLET | ORAL | 0 refills | Status: AC

## 2019-05-07 NOTE — Telephone Encounter (Signed)
Medication Refill - Medication: tadalafil (CIALIS) 20 MG tablet  Has the patient contacted their pharmacy? Yes.   (Agent: If no, request that the patient contact the pharmacy for the refill.) (Agent: If yes, when and what did the pharmacy advise?)  Preferred Pharmacy (with phone number or street name): [facility] [address] [place]: Please be advised that RX refills may take up to 3 business days. We ask that you follow-up with your pharmacy.

## 2019-08-10 ENCOUNTER — Telehealth: Payer: Self-pay | Admitting: Family Medicine

## 2019-08-10 DIAGNOSIS — N529 Male erectile dysfunction, unspecified: Secondary | ICD-10-CM

## 2019-08-10 NOTE — Telephone Encounter (Signed)
Requested medication (s) are due for refill today: yes  Requested medication (s) are on the active medication list: yes  Last refill:  05/07/19  Future visit scheduled: no  Notes to clinic:  Urology: Erectile Dysfunction Agents failed  Requested Prescriptions  Pending Prescriptions Disp Refills   tadalafil (CIALIS) 20 MG tablet 30 tablet 0    Sig: TAKE ONE TABLET BY MOUTH DAILY AS NEEDED FOR ERECTILE DYSFUNCTION      Urology: Erectile Dysfunction Agents Failed - 08/10/2019 11:40 AM      Failed - Last BP in normal range    BP Readings from Last 1 Encounters:  12/12/18 138/90          Passed - Valid encounter within last 12 months    Recent Outpatient Visits           8 months ago Mild episode of recurrent major depressive disorder [facility])   Primary Care at [facility], Minerva Fester, MD   1 year ago Snoring   Primary Care at [facility], Madelaine Bhat, PA-C   2 years ago Depression, unspecified depression type   Primary Care at [facility], Madelaine Bhat, PA-C   3 years ago Depression   Primary Care at [place], [place], [place]   5 years ago Exposure to trichomonas   Primary Care at [facility], Dema Severin, PA-C

## 2019-08-10 NOTE — Telephone Encounter (Signed)
Medication Refill - Medication: tadalafil (CIALIS) 20 MG tablet ° ° ° ° °Preferred Pharmacy (with phone number or street name):  °[facility] [place] [facility] - [place], [place] - [address] Phone:  [phone]  °Fax:  [phone]  °  ° ° ° °Agent: Please be advised that RX refills may take up to 3 business days. We ask that you follow-up with your pharmacy. ° °

## 2019-08-14 ENCOUNTER — Other Ambulatory Visit: Payer: Self-pay | Admitting: Family Medicine

## 2019-08-14 DIAGNOSIS — N529 Male erectile dysfunction, unspecified: Secondary | ICD-10-CM

## 2019-08-14 NOTE — Telephone Encounter (Signed)
It doesn't look loke this is a patient in this office. Please advise.

## 2019-08-14 NOTE — Telephone Encounter (Signed)
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

## 2019-08-15 ENCOUNTER — Telehealth: Payer: Self-pay | Admitting: General Practice

## 2019-08-15 NOTE — Telephone Encounter (Signed)
Attempted to call pt, however voicemail box has not been set up yet.   If pt calls back. We are unable to fill his medication because he has not been seen in over 6 months and the issue was not addressed in that last visit.   He is to make an appointment to get medication.

## 2019-08-15 NOTE — Telephone Encounter (Signed)
Pt called for a med refill. He was told he needed a toc appt to get the refill. He said he will call back to make the appt.

## 2019-10-01 ENCOUNTER — Ambulatory Visit: Payer: Self-pay | Admitting: Registered Nurse

## 2019-10-02 ENCOUNTER — Encounter: Payer: Self-pay | Admitting: Registered Nurse
